# Patient Record
Sex: Female | Born: 1950 | Race: White | Hispanic: No | Marital: Married | Smoking: Never smoker
Health system: Southern US, Community
[De-identification: ages and names within clinical notes are randomized; demographics above are authoritative.]

## PROBLEM LIST (undated history)

## (undated) DIAGNOSIS — E7800 Pure hypercholesterolemia, unspecified: Secondary | ICD-10-CM

## (undated) DIAGNOSIS — K5790 Diverticulosis of intestine, part unspecified, without perforation or abscess without bleeding: Secondary | ICD-10-CM

## (undated) DIAGNOSIS — R112 Nausea with vomiting, unspecified: Secondary | ICD-10-CM

## (undated) DIAGNOSIS — D649 Anemia, unspecified: Secondary | ICD-10-CM

## (undated) DIAGNOSIS — M1990 Unspecified osteoarthritis, unspecified site: Secondary | ICD-10-CM

## (undated) DIAGNOSIS — I10 Essential (primary) hypertension: Secondary | ICD-10-CM

## (undated) DIAGNOSIS — E119 Type 2 diabetes mellitus without complications: Secondary | ICD-10-CM

## (undated) DIAGNOSIS — Z9889 Other specified postprocedural states: Secondary | ICD-10-CM

## (undated) DIAGNOSIS — Z87442 Personal history of urinary calculi: Secondary | ICD-10-CM

## (undated) HISTORY — PX: ABDOMINAL HYSTERECTOMY: SHX81

## (undated) HISTORY — PX: LAPAROSCOPIC SALPINGOOPHERECTOMY: SUR795

## (undated) HISTORY — PX: DILATION AND CURETTAGE OF UTERUS: SHX78

## (undated) HISTORY — PX: CHOLECYSTECTOMY: SHX55

## (undated) HISTORY — PX: COLONOSCOPY: SHX174

---

## 2004-05-22 ENCOUNTER — Ambulatory Visit: Payer: Self-pay | Admitting: Family Medicine

## 2004-05-28 ENCOUNTER — Ambulatory Visit: Payer: Self-pay | Admitting: Family Medicine

## 2004-06-17 ENCOUNTER — Ambulatory Visit: Payer: Self-pay | Admitting: Unknown Physician Specialty

## 2004-12-17 ENCOUNTER — Emergency Department: Payer: Self-pay | Admitting: Emergency Medicine

## 2005-06-10 ENCOUNTER — Ambulatory Visit: Payer: Self-pay | Admitting: Family Medicine

## 2006-08-17 ENCOUNTER — Ambulatory Visit: Payer: Self-pay | Admitting: Family Medicine

## 2007-09-09 ENCOUNTER — Ambulatory Visit: Payer: Self-pay

## 2008-09-18 ENCOUNTER — Ambulatory Visit: Payer: Self-pay | Admitting: Unknown Physician Specialty

## 2008-09-27 ENCOUNTER — Ambulatory Visit: Payer: Self-pay | Admitting: Family Medicine

## 2009-10-29 ENCOUNTER — Ambulatory Visit: Payer: Self-pay | Admitting: Family Medicine

## 2010-01-03 ENCOUNTER — Ambulatory Visit: Payer: Self-pay | Admitting: Family Medicine

## 2011-09-23 ENCOUNTER — Ambulatory Visit: Payer: Self-pay | Admitting: Family Medicine

## 2014-02-20 ENCOUNTER — Emergency Department: Payer: Self-pay | Admitting: Emergency Medicine

## 2014-02-20 LAB — BASIC METABOLIC PANEL
Anion Gap: 8 (ref 7–16)
BUN: 33 mg/dL — ABNORMAL HIGH (ref 7–18)
Calcium, Total: 9.1 mg/dL (ref 8.5–10.1)
Chloride: 109 mmol/L — ABNORMAL HIGH (ref 98–107)
Co2: 23 mmol/L (ref 21–32)
Creatinine: 1.25 mg/dL (ref 0.60–1.30)
EGFR (African American): 53 — ABNORMAL LOW
EGFR (Non-African Amer.): 46 — ABNORMAL LOW
Glucose: 129 mg/dL — ABNORMAL HIGH (ref 65–99)
Osmolality: 288 (ref 275–301)
Potassium: 5 mmol/L (ref 3.5–5.1)
Sodium: 140 mmol/L (ref 136–145)

## 2014-02-20 LAB — CBC
HCT: 37.3 % (ref 35.0–47.0)
HGB: 12.3 g/dL (ref 12.0–16.0)
MCH: 30.3 pg (ref 26.0–34.0)
MCHC: 32.8 g/dL (ref 32.0–36.0)
MCV: 92 fL (ref 80–100)
PLATELETS: 257 10*3/uL (ref 150–440)
RBC: 4.05 10*6/uL (ref 3.80–5.20)
RDW: 13.4 % (ref 11.5–14.5)
WBC: 7.4 10*3/uL (ref 3.6–11.0)

## 2015-12-28 ENCOUNTER — Other Ambulatory Visit: Payer: Self-pay | Admitting: Family Medicine

## 2015-12-28 DIAGNOSIS — Z1231 Encounter for screening mammogram for malignant neoplasm of breast: Secondary | ICD-10-CM

## 2016-01-14 ENCOUNTER — Ambulatory Visit
Admission: RE | Admit: 2016-01-14 | Discharge: 2016-01-14 | Disposition: A | Discharge status: Home / Self Care | Payer: Medicare Other | Source: Ambulatory Visit | Attending: Family Medicine | Admitting: Family Medicine

## 2016-01-14 DIAGNOSIS — Z1231 Encounter for screening mammogram for malignant neoplasm of breast: Secondary | ICD-10-CM | POA: Diagnosis present

## 2016-01-14 DIAGNOSIS — R928 Other abnormal and inconclusive findings on diagnostic imaging of breast: Secondary | ICD-10-CM | POA: Diagnosis not present

## 2016-01-23 ENCOUNTER — Other Ambulatory Visit: Payer: Self-pay | Admitting: Family Medicine

## 2016-01-23 DIAGNOSIS — N6320 Unspecified lump in the left breast, unspecified quadrant: Secondary | ICD-10-CM

## 2016-02-01 ENCOUNTER — Ambulatory Visit
Admission: RE | Admit: 2016-02-01 | Discharge: 2016-02-01 | Disposition: A | Discharge status: Home / Self Care | Payer: Medicare Other | Source: Ambulatory Visit | Attending: Family Medicine | Admitting: Family Medicine

## 2016-02-01 DIAGNOSIS — N6320 Unspecified lump in the left breast, unspecified quadrant: Secondary | ICD-10-CM

## 2016-02-01 DIAGNOSIS — N63 Unspecified lump in breast: Secondary | ICD-10-CM | POA: Diagnosis present

## 2016-02-01 DIAGNOSIS — R928 Other abnormal and inconclusive findings on diagnostic imaging of breast: Secondary | ICD-10-CM | POA: Diagnosis not present

## 2016-11-04 ENCOUNTER — Other Ambulatory Visit: Payer: Self-pay | Admitting: Family Medicine

## 2016-11-04 DIAGNOSIS — Z780 Asymptomatic menopausal state: Secondary | ICD-10-CM

## 2017-02-27 ENCOUNTER — Other Ambulatory Visit: Payer: Self-pay | Admitting: Orthopedic Surgery

## 2017-02-27 DIAGNOSIS — M1711 Unilateral primary osteoarthritis, right knee: Secondary | ICD-10-CM

## 2017-03-18 ENCOUNTER — Ambulatory Visit
Admission: RE | Admit: 2017-03-18 | Discharge: 2017-03-18 | Disposition: A | Discharge status: Home / Self Care | Payer: Medicare Other | Source: Ambulatory Visit | Attending: Orthopedic Surgery | Admitting: Orthopedic Surgery

## 2017-03-18 DIAGNOSIS — M1711 Unilateral primary osteoarthritis, right knee: Secondary | ICD-10-CM | POA: Insufficient documentation

## 2017-03-25 ENCOUNTER — Encounter
Admission: RE | Admit: 2017-03-25 | Discharge: 2017-03-25 | Disposition: A | Discharge status: Home / Self Care | Payer: Medicare Other | Source: Ambulatory Visit | Attending: Orthopedic Surgery | Admitting: Orthopedic Surgery

## 2017-03-25 DIAGNOSIS — D649 Anemia, unspecified: Secondary | ICD-10-CM | POA: Diagnosis not present

## 2017-03-25 DIAGNOSIS — E119 Type 2 diabetes mellitus without complications: Secondary | ICD-10-CM | POA: Insufficient documentation

## 2017-03-25 DIAGNOSIS — Z79899 Other long term (current) drug therapy: Secondary | ICD-10-CM | POA: Diagnosis not present

## 2017-03-25 DIAGNOSIS — E7800 Pure hypercholesterolemia, unspecified: Secondary | ICD-10-CM | POA: Insufficient documentation

## 2017-03-25 DIAGNOSIS — Z01818 Encounter for other preprocedural examination: Secondary | ICD-10-CM | POA: Insufficient documentation

## 2017-03-25 DIAGNOSIS — Z7982 Long term (current) use of aspirin: Secondary | ICD-10-CM | POA: Diagnosis not present

## 2017-03-25 DIAGNOSIS — M1711 Unilateral primary osteoarthritis, right knee: Secondary | ICD-10-CM | POA: Diagnosis not present

## 2017-03-25 DIAGNOSIS — Z7984 Long term (current) use of oral hypoglycemic drugs: Secondary | ICD-10-CM | POA: Insufficient documentation

## 2017-03-25 DIAGNOSIS — I10 Essential (primary) hypertension: Secondary | ICD-10-CM | POA: Diagnosis not present

## 2017-03-25 HISTORY — DX: Essential (primary) hypertension: I10

## 2017-03-25 HISTORY — DX: Pure hypercholesterolemia, unspecified: E78.00

## 2017-03-25 HISTORY — DX: Personal history of urinary calculi: Z87.442

## 2017-03-25 HISTORY — DX: Diverticulosis of intestine, part unspecified, without perforation or abscess without bleeding: K57.90

## 2017-03-25 HISTORY — DX: Unspecified osteoarthritis, unspecified site: M19.90

## 2017-03-25 HISTORY — DX: Anemia, unspecified: D64.9

## 2017-03-25 HISTORY — DX: Type 2 diabetes mellitus without complications: E11.9

## 2017-03-25 LAB — CBC
HEMATOCRIT: 36.3 % (ref 35.0–47.0)
Hemoglobin: 12.3 g/dL (ref 12.0–16.0)
MCH: 31.4 pg (ref 26.0–34.0)
MCHC: 34 g/dL (ref 32.0–36.0)
MCV: 92.5 fL (ref 80.0–100.0)
Platelets: 265 10*3/uL (ref 150–440)
RBC: 3.93 MIL/uL (ref 3.80–5.20)
RDW: 14.5 % (ref 11.5–14.5)
WBC: 6.6 10*3/uL (ref 3.6–11.0)

## 2017-03-25 LAB — PROTIME-INR
INR: 0.97
Prothrombin Time: 12.8 seconds (ref 11.4–15.2)

## 2017-03-25 LAB — URINALYSIS, COMPLETE (UACMP) WITH MICROSCOPIC
Bilirubin Urine: NEGATIVE
GLUCOSE, UA: NEGATIVE mg/dL
HGB URINE DIPSTICK: NEGATIVE
Ketones, ur: NEGATIVE mg/dL
NITRITE: POSITIVE — AB
PROTEIN: NEGATIVE mg/dL
RBC / HPF: NONE SEEN RBC/hpf (ref 0–5)
SPECIFIC GRAVITY, URINE: 1.006 (ref 1.005–1.030)
Squamous Epithelial / LPF: NONE SEEN
pH: 6 (ref 5.0–8.0)

## 2017-03-25 LAB — SURGICAL PCR SCREEN
MRSA, PCR: NEGATIVE
Staphylococcus aureus: POSITIVE — AB

## 2017-03-25 LAB — APTT: aPTT: 31 seconds (ref 24–36)

## 2017-03-25 LAB — BASIC METABOLIC PANEL
Anion gap: 8 (ref 5–15)
BUN: 29 mg/dL — AB (ref 6–20)
CALCIUM: 9.7 mg/dL (ref 8.9–10.3)
CHLORIDE: 108 mmol/L (ref 101–111)
CO2: 24 mmol/L (ref 22–32)
Creatinine, Ser: 1.14 mg/dL — ABNORMAL HIGH (ref 0.44–1.00)
GFR calc non Af Amer: 49 mL/min — ABNORMAL LOW (ref >60)
GFR, EST AFRICAN AMERICAN: 57 mL/min — AB (ref >60)
GLUCOSE: 83 mg/dL (ref 65–99)
Potassium: 4.2 mmol/L (ref 3.5–5.1)
Sodium: 140 mmol/L (ref 135–145)

## 2017-03-25 LAB — SEDIMENTATION RATE: SED RATE: 30 mm/h (ref 0–30)

## 2017-03-25 LAB — TYPE AND SCREEN
ABO/RH(D): O POS
ANTIBODY SCREEN: NEGATIVE

## 2017-03-25 NOTE — Patient Instructions (Signed)
Your procedure is scheduled on: April 07, 2017 [facility](TUESDAY ) Report to Same Day Surgery 2nd floor medical mall (Medical Mall Entrance-take elevator on left to 2nd floor.  Check in with surgery information desk.) To find out your arrival time please call [phone] between 1PM - 3PM on April 06, 2017 (MONDAY )   Remember: Instructions that are not followed completely may result in serious medical risk, up to and including death, or upon the discretion of your surgeon and anesthesiologist your surgery may need to be rescheduled.    _x___ 1. Do not eat food after midnight the night before your procedure. You may drink clear liquids up to 2 hours before you are scheduled to arrive at the hospital for your procedure.  Do not drink clear liquids within 2 hours of your scheduled arrival to the hospital.  Clear liquids include  --Water or Apple juice without pulp  --Clear carbohydrate beverage such as ClearFast or Gatorade  --Black Coffee or Clear Tea (No milk, no creamers, do not add anything to                  the coffee or Tea Type 1 and type 2 diabetics should only drink water.  No gum chewing or hard candies.     __x__ 2. No Alcohol for 24 hours before or after surgery.   __x__3. No Smoking for 24 prior to surgery.   ____  4. Bring all medications with you on the day of surgery if instructed.    __x__ 5. Notify your doctor if there is any change in your medical condition     (cold, fever, infections).     Do not wear jewelry, make-up, hairpins, clips or nail polish.  Do not wear lotions, powders, or perfumes.   Do not shave 48 hours prior to surgery. Men may shave face and neck.  Do not bring valuables to the hospital.    [facility] not responsible for any belongings or valuables.               Contacts, dentures or bridgework may not be worn into surgery.  Leave your suitcase in the car. After surgery it may be brought to your room.  For patients admitted to the  hospital, discharge time is determined by your treatment team                      Patients discharged the day of surgery will not be allowed to drive home.  You will need someone to drive you home and stay with you the night of your procedure.    Please read over the following fact sheets that you were given:   [facility] Preparing for [facility] and or MRSA Information   _x___ TAKE THE FOLLOWING MEDICATION THE MORNING OF SURGERY WITH A SIP OF WATER .   1. METOPROLOL    ____Fleets enema or Magnesium Citrate as directed.   _x___ Use CHG Soap or sage wipes as directed on instruction sheet   ____ Use inhalers on the day of surgery and bring to hospital day of surgery  __X__ Stop Metformin 2 days prior to surgery. (STOP METFORMIN ON SEPTEMBER  23 )    ____ Take 1/2 of usual insulin dose the night before surgery and none on the morning     surgery.   _x___ Follow recommendations from Cardiologist, Pulmonologist or PCP regarding          stopping Aspirin, Coumadin,  Plavix ,Eliquis, Effient, or Pradaxa, and Pletal. (STOP ASPIRIN SEVEN DAYS PRIOR TO SURGERY )  X____Stop Anti-inflammatories such as Advil, Aleve, Ibuprofen, Motrin, Naproxen, Naprosyn, Goodies powders or aspirin products. OK to take Tylenol   _x___ Stop supplements until after surgery.  But may continue Vitamin D, Vitamin B, and multivitamin (STOP KRILL OIL NOW )       ____ Bring C-Pap to the hospital.

## 2017-03-27 LAB — URINE CULTURE: Culture: >=100000 — AB

## 2017-04-07 ENCOUNTER — Encounter
Admission: RE | Disposition: A | Discharge status: Home Health | Payer: Self-pay | Source: Ambulatory Visit | Attending: Orthopedic Surgery

## 2017-04-07 ENCOUNTER — Inpatient Hospital Stay: Payer: Medicare Other

## 2017-04-07 ENCOUNTER — Inpatient Hospital Stay: Payer: Medicare Other | Admitting: Anesthesiology

## 2017-04-07 ENCOUNTER — Inpatient Hospital Stay
Admission: RE | Admit: 2017-04-07 | Discharge: 2017-04-09 | DRG: 470 | Disposition: A | Discharge status: Home Health | Payer: Medicare Other | Source: Ambulatory Visit | Attending: Orthopedic Surgery | Admitting: Orthopedic Surgery

## 2017-04-07 DIAGNOSIS — I10 Essential (primary) hypertension: Secondary | ICD-10-CM | POA: Diagnosis present

## 2017-04-07 DIAGNOSIS — E7800 Pure hypercholesterolemia, unspecified: Secondary | ICD-10-CM | POA: Diagnosis present

## 2017-04-07 DIAGNOSIS — Z6837 Body mass index (BMI) 37.0-37.9, adult: Secondary | ICD-10-CM

## 2017-04-07 DIAGNOSIS — E669 Obesity, unspecified: Secondary | ICD-10-CM | POA: Diagnosis present

## 2017-04-07 DIAGNOSIS — E119 Type 2 diabetes mellitus without complications: Secondary | ICD-10-CM | POA: Diagnosis present

## 2017-04-07 DIAGNOSIS — G8918 Other acute postprocedural pain: Secondary | ICD-10-CM

## 2017-04-07 DIAGNOSIS — M1711 Unilateral primary osteoarthritis, right knee: Secondary | ICD-10-CM | POA: Diagnosis present

## 2017-04-07 HISTORY — DX: Nausea with vomiting, unspecified: R11.2

## 2017-04-07 HISTORY — PX: TOTAL KNEE ARTHROPLASTY: SHX125

## 2017-04-07 HISTORY — DX: Other specified postprocedural states: Z98.890

## 2017-04-07 LAB — CBC
HCT: 37.3 % (ref 35.0–47.0)
HEMOGLOBIN: 12.5 g/dL (ref 12.0–16.0)
MCH: 31.3 pg (ref 26.0–34.0)
MCHC: 33.4 g/dL (ref 32.0–36.0)
MCV: 93.5 fL (ref 80.0–100.0)
PLATELETS: 232 10*3/uL (ref 150–440)
RBC: 3.98 MIL/uL (ref 3.80–5.20)
RDW: 14.6 % — ABNORMAL HIGH (ref 11.5–14.5)
WBC: 9 10*3/uL (ref 3.6–11.0)

## 2017-04-07 LAB — ABO/RH: ABO/RH(D): O POS

## 2017-04-07 LAB — GLUCOSE, CAPILLARY
Glucose-Capillary: 160 mg/dL — ABNORMAL HIGH (ref 65–99)
Glucose-Capillary: 119 mg/dL — ABNORMAL HIGH (ref 65–99)
Glucose-Capillary: 168 mg/dL — ABNORMAL HIGH (ref 65–99)
Glucose-Capillary: 168 mg/dL — ABNORMAL HIGH (ref 65–99)

## 2017-04-07 LAB — CREATININE, SERUM
CREATININE: 0.87 mg/dL (ref 0.44–1.00)
GFR calc non Af Amer: >60 mL/min (ref >60)

## 2017-04-07 SURGERY — ARTHROPLASTY, KNEE, TOTAL
Anesthesia: Spinal | Site: Knee | Laterality: Right | Wound class: Clean

## 2017-04-07 MED ORDER — ONDANSETRON HCL 4 MG/2ML IJ SOLN
INTRAMUSCULAR | Status: DC | PRN
  Administered 2017-04-07: 4 mg via INTRAVENOUS

## 2017-04-07 MED ORDER — SIMVASTATIN 20 MG PO TABS
20.0000 mg | ORAL_TABLET | Freq: Every day | ORAL | Status: DC
  Administered 2017-04-07 – 2017-04-08 (×2): 20 mg via ORAL
  Filled 2017-04-07 (×2): qty 1

## 2017-04-07 MED ORDER — METFORMIN HCL 500 MG PO TABS
1000.0000 mg | ORAL_TABLET | Freq: Two times a day (BID) | ORAL | Status: DC
  Administered 2017-04-08 – 2017-04-09 (×3): 1000 mg via ORAL
  Filled 2017-04-07 (×4): qty 2

## 2017-04-07 MED ORDER — PROPOFOL 500 MG/50ML IV EMUL
INTRAVENOUS | Status: AC
  Filled 2017-04-07: qty 50

## 2017-04-07 MED ORDER — METOPROLOL SUCCINATE ER 25 MG PO TB24
25.0000 mg | ORAL_TABLET | Freq: Every day | ORAL | Status: DC
  Administered 2017-04-08 – 2017-04-09 (×2): 25 mg via ORAL
  Filled 2017-04-07: qty 1

## 2017-04-07 MED ORDER — ONDANSETRON HCL 4 MG/2ML IJ SOLN: 4.0000 mg | Freq: Once | INTRAMUSCULAR | Status: DC | PRN

## 2017-04-07 MED ORDER — NEOMYCIN-POLYMYXIN B GU 40-200000 IR SOLN
Status: DC | PRN
  Administered 2017-04-07: 14 mL

## 2017-04-07 MED ORDER — LINAGLIPTIN 5 MG PO TABS
5.0000 mg | ORAL_TABLET | Freq: Every day | ORAL | Status: DC
  Administered 2017-04-08 – 2017-04-09 (×2): 5 mg via ORAL
  Filled 2017-04-07 (×2): qty 1

## 2017-04-07 MED ORDER — HYDROCHLOROTHIAZIDE 12.5 MG PO CAPS
37.5000 mg | ORAL_CAPSULE | Freq: Every day | ORAL | Status: DC
  Administered 2017-04-08 – 2017-04-09 (×2): 37.5 mg via ORAL
  Filled 2017-04-07 (×2): qty 3

## 2017-04-07 MED ORDER — MENTHOL 3 MG MT LOZG
1.0000 | LOZENGE | OROMUCOSAL | Status: DC | PRN
Start: 2017-04-07 — End: 2017-04-09
  Filled 2017-04-07: qty 9

## 2017-04-07 MED ORDER — PHENOL 1.4 % MT LIQD
1.0000 | OROMUCOSAL | Status: DC | PRN
  Filled 2017-04-07: qty 177

## 2017-04-07 MED ORDER — EPHEDRINE SULFATE 50 MG/ML IJ SOLN
INTRAMUSCULAR | Status: AC
  Filled 2017-04-07: qty 1

## 2017-04-07 MED ORDER — METOCLOPRAMIDE HCL 5 MG/ML IJ SOLN: 5.0000 mg | Freq: Three times a day (TID) | INTRAMUSCULAR | Status: DC | PRN

## 2017-04-07 MED ORDER — FAMOTIDINE 20 MG PO TABS
20.0000 mg | ORAL_TABLET | Freq: Once | ORAL | Status: AC
  Administered 2017-04-07: 20 mg via ORAL

## 2017-04-07 MED ORDER — MIDAZOLAM HCL 2 MG/2ML IJ SOLN
INTRAMUSCULAR | Status: AC
  Filled 2017-04-07: qty 2

## 2017-04-07 MED ORDER — LISINOPRIL 10 MG PO TABS
30.0000 mg | ORAL_TABLET | Freq: Every day | ORAL | Status: DC
  Administered 2017-04-08 – 2017-04-09 (×2): 30 mg via ORAL
  Filled 2017-04-07 (×2): qty 1

## 2017-04-07 MED ORDER — LISINOPRIL-HYDROCHLOROTHIAZIDE 20-25 MG PO TABS: 1.5000 | ORAL_TABLET | Freq: Every day | ORAL | Status: DC

## 2017-04-07 MED ORDER — CEFAZOLIN SODIUM-DEXTROSE 2-4 GM/100ML-% IV SOLN
2.0000 g | Freq: Once | INTRAVENOUS | Status: AC
  Administered 2017-04-07: 2 g via INTRAVENOUS

## 2017-04-07 MED ORDER — METHOCARBAMOL 1000 MG/10ML IJ SOLN
500.0000 mg | Freq: Four times a day (QID) | INTRAVENOUS | Status: DC | PRN
  Filled 2017-04-07: qty 5

## 2017-04-07 MED ORDER — ZOLPIDEM TARTRATE 5 MG PO TABS: 5.0000 mg | ORAL_TABLET | Freq: Every evening | ORAL | Status: DC | PRN

## 2017-04-07 MED ORDER — BUPIVACAINE HCL (PF) 0.5 % IJ SOLN
INTRAMUSCULAR | Status: DC | PRN
  Administered 2017-04-07: 3 mL

## 2017-04-07 MED ORDER — ALUM & MAG HYDROXIDE-SIMETH 200-200-20 MG/5ML PO SUSP
30.0000 mL | Freq: Three times a day (TID) | ORAL | Status: DC | PRN
  Administered 2017-04-07: 30 mL via ORAL
  Filled 2017-04-07 (×3): qty 30

## 2017-04-07 MED ORDER — FAMOTIDINE 20 MG PO TABS
ORAL_TABLET | ORAL | Status: AC
  Filled 2017-04-07: qty 1

## 2017-04-07 MED ORDER — ACETAMINOPHEN 325 MG PO TABS
650.0000 mg | ORAL_TABLET | Freq: Four times a day (QID) | ORAL | Status: DC | PRN
  Administered 2017-04-08: 650 mg via ORAL
  Filled 2017-04-07: qty 2

## 2017-04-07 MED ORDER — ASPIRIN EC 81 MG PO TBEC
81.0000 mg | DELAYED_RELEASE_TABLET | Freq: Every day | ORAL | Status: DC
  Administered 2017-04-08 – 2017-04-09 (×2): 81 mg via ORAL
  Filled 2017-04-07 (×2): qty 1

## 2017-04-07 MED ORDER — SODIUM CHLORIDE 0.9 % IV SOLN
INTRAVENOUS | Status: DC
  Administered 2017-04-07: 10:00:00 via INTRAVENOUS

## 2017-04-07 MED ORDER — EPHEDRINE SULFATE 50 MG/ML IJ SOLN
INTRAMUSCULAR | Status: DC | PRN
  Administered 2017-04-07: 10 mg via INTRAVENOUS

## 2017-04-07 MED ORDER — METOCLOPRAMIDE HCL 10 MG PO TABS
5.0000 mg | ORAL_TABLET | Freq: Three times a day (TID) | ORAL | Status: DC | PRN
  Administered 2017-04-08: 10 mg via ORAL
  Filled 2017-04-07: qty 1

## 2017-04-07 MED ORDER — CEFAZOLIN SODIUM-DEXTROSE 2-4 GM/100ML-% IV SOLN
INTRAVENOUS | Status: AC
  Filled 2017-04-07: qty 100

## 2017-04-07 MED ORDER — TRANEXAMIC ACID 1000 MG/10ML IV SOLN
1000.0000 mg | INTRAVENOUS | Status: DC
  Filled 2017-04-07: qty 10

## 2017-04-07 MED ORDER — MAGNESIUM HYDROXIDE 400 MG/5ML PO SUSP
30.0000 mL | Freq: Every day | ORAL | Status: DC | PRN
  Administered 2017-04-08: 30 mL via ORAL
  Filled 2017-04-07: qty 30

## 2017-04-07 MED ORDER — MAGNESIUM CITRATE PO SOLN
1.0000 | Freq: Once | ORAL | Status: DC | PRN
  Filled 2017-04-07: qty 296

## 2017-04-07 MED ORDER — INSULIN ASPART 100 UNIT/ML ~~LOC~~ SOLN
0.0000 [IU] | Freq: Three times a day (TID) | SUBCUTANEOUS | Status: DC
  Administered 2017-04-07: 3 [IU] via SUBCUTANEOUS
  Administered 2017-04-08: 5 [IU] via SUBCUTANEOUS
  Administered 2017-04-08 – 2017-04-09 (×3): 3 [IU] via SUBCUTANEOUS
  Filled 2017-04-07 (×5): qty 1

## 2017-04-07 MED ORDER — ONDANSETRON HCL 4 MG/2ML IJ SOLN
INTRAMUSCULAR | Status: AC
  Filled 2017-04-07: qty 2

## 2017-04-07 MED ORDER — MORPHINE SULFATE 10 MG/ML IJ SOLN
INTRAMUSCULAR | Status: DC | PRN
Start: 2017-04-07 — End: 2017-04-07
  Administered 2017-04-07: 10 mg

## 2017-04-07 MED ORDER — BISACODYL 10 MG RE SUPP: 10.0000 mg | Freq: Every day | RECTAL | Status: DC | PRN

## 2017-04-07 MED ORDER — BUPIVACAINE-EPINEPHRINE (PF) 0.25% -1:200000 IJ SOLN
INTRAMUSCULAR | Status: DC | PRN
  Administered 2017-04-07: 30 mL

## 2017-04-07 MED ORDER — ONDANSETRON HCL 4 MG PO TABS
4.0000 mg | ORAL_TABLET | Freq: Four times a day (QID) | ORAL | Status: DC | PRN
  Administered 2017-04-08: 4 mg via ORAL
  Filled 2017-04-07: qty 1

## 2017-04-07 MED ORDER — SODIUM CHLORIDE 0.9 % IV SOLN
INTRAVENOUS | Status: DC
  Administered 2017-04-07: 16:00:00 via INTRAVENOUS

## 2017-04-07 MED ORDER — TRANEXAMIC ACID 1000 MG/10ML IV SOLN
INTRAVENOUS | Status: DC | PRN
  Administered 2017-04-07: 1000 mg via INTRAVENOUS

## 2017-04-07 MED ORDER — ONDANSETRON HCL 4 MG/2ML IJ SOLN
4.0000 mg | Freq: Four times a day (QID) | INTRAMUSCULAR | Status: DC | PRN
  Filled 2017-04-07: qty 2

## 2017-04-07 MED ORDER — DOCUSATE SODIUM 100 MG PO CAPS
100.0000 mg | ORAL_CAPSULE | Freq: Two times a day (BID) | ORAL | Status: DC
  Administered 2017-04-07 – 2017-04-09 (×4): 100 mg via ORAL
  Filled 2017-04-07 (×4): qty 1

## 2017-04-07 MED ORDER — FENTANYL CITRATE (PF) 100 MCG/2ML IJ SOLN: 25.0000 ug | INTRAMUSCULAR | Status: DC | PRN

## 2017-04-07 MED ORDER — ALUM & MAG HYDROXIDE-SIMETH 200-200-20 MG/5ML PO SUSP: 30.0000 mL | ORAL | Status: DC | PRN

## 2017-04-07 MED ORDER — CEFAZOLIN SODIUM-DEXTROSE 2-4 GM/100ML-% IV SOLN
2.0000 g | Freq: Four times a day (QID) | INTRAVENOUS | Status: DC
  Filled 2017-04-07 (×3): qty 100

## 2017-04-07 MED ORDER — PROPOFOL 500 MG/50ML IV EMUL
INTRAVENOUS | Status: DC | PRN
  Administered 2017-04-07: 75 ug/kg/min via INTRAVENOUS

## 2017-04-07 MED ORDER — FAMOTIDINE 20 MG PO TABS
20.0000 mg | ORAL_TABLET | Freq: Every day | ORAL | Status: DC
  Administered 2017-04-07 – 2017-04-09 (×3): 20 mg via ORAL
  Filled 2017-04-07 (×3): qty 1

## 2017-04-07 MED ORDER — OXYCODONE HCL 5 MG PO TABS
5.0000 mg | ORAL_TABLET | ORAL | Status: DC | PRN
  Administered 2017-04-07 (×2): 5 mg via ORAL
  Administered 2017-04-07 – 2017-04-08 (×3): 10 mg via ORAL
  Administered 2017-04-09 (×2): 5 mg via ORAL
  Filled 2017-04-07 (×3): qty 1
  Filled 2017-04-07 (×3): qty 2
  Filled 2017-04-07: qty 1

## 2017-04-07 MED ORDER — SODIUM CHLORIDE 0.9 % IV SOLN
INTRAVENOUS | Status: DC | PRN
  Administered 2017-04-07: 30 ug/min via INTRAVENOUS

## 2017-04-07 MED ORDER — ENOXAPARIN SODIUM 30 MG/0.3ML ~~LOC~~ SOLN
30.0000 mg | Freq: Two times a day (BID) | SUBCUTANEOUS | Status: DC
  Administered 2017-04-08 – 2017-04-09 (×3): 30 mg via SUBCUTANEOUS
  Filled 2017-04-07 (×3): qty 0.3

## 2017-04-07 MED ORDER — GLIPIZIDE ER 10 MG PO TB24
20.0000 mg | ORAL_TABLET | Freq: Every day | ORAL | Status: DC
  Administered 2017-04-08 – 2017-04-09 (×2): 20 mg via ORAL
  Filled 2017-04-07 (×2): qty 2

## 2017-04-07 MED ORDER — METHOCARBAMOL 500 MG PO TABS: 500.0000 mg | ORAL_TABLET | Freq: Four times a day (QID) | ORAL | Status: DC | PRN

## 2017-04-07 MED ORDER — DIPHENHYDRAMINE HCL 12.5 MG/5ML PO ELIX: 12.5000 mg | ORAL_SOLUTION | ORAL | Status: DC | PRN

## 2017-04-07 MED ORDER — DEXTROSE 5 % IV SOLN
Freq: Four times a day (QID) | INTRAVENOUS | Status: AC
  Administered 2017-04-07 – 2017-04-08 (×3): via INTRAVENOUS
  Filled 2017-04-07 (×5): qty 20

## 2017-04-07 MED ORDER — ONDANSETRON HCL 4 MG/2ML IJ SOLN
INTRAMUSCULAR | Status: AC
  Administered 2017-04-07: 18:00:00
  Filled 2017-04-07: qty 2

## 2017-04-07 MED ORDER — ACETAMINOPHEN 650 MG RE SUPP: 650.0000 mg | Freq: Four times a day (QID) | RECTAL | Status: DC | PRN

## 2017-04-07 MED ORDER — MORPHINE SULFATE (PF) 2 MG/ML IV SOLN
2.0000 mg | INTRAVENOUS | Status: DC | PRN
  Administered 2017-04-07: 2 mg via INTRAVENOUS
  Filled 2017-04-07: qty 1

## 2017-04-07 MED ORDER — PIOGLITAZONE HCL 15 MG PO TABS
45.0000 mg | ORAL_TABLET | Freq: Every day | ORAL | Status: DC
  Administered 2017-04-08 – 2017-04-09 (×2): 45 mg via ORAL
  Filled 2017-04-07 (×3): qty 3

## 2017-04-07 MED ORDER — SODIUM CHLORIDE 0.9 % IV SOLN
INTRAVENOUS | Status: DC | PRN
  Administered 2017-04-07: 60 mL

## 2017-04-07 SURGICAL SUPPLY — 61 items
BANDAGE ACE 6X5 VEL STRL LF (GAUZE/BANDAGES/DRESSINGS) ×3 IMPLANT
BLADE SAW 1 (BLADE) ×3 IMPLANT
BLOCK CUTTING FEMUR 3 RT MED (MISCELLANEOUS) IMPLANT
BLOCK CUTTING TIBIAL 3 RT (MISCELLANEOUS) IMPLANT
BLOCK CUTTING TIBIAL 4 RT MIS (MISCELLANEOUS) IMPLANT
BONE CEMENT GENTAMICIN (Cement) ×6 IMPLANT
CANISTER SUCT 1200ML W/VALVE (MISCELLANEOUS) ×3 IMPLANT
CANISTER SUCT 3000ML PPV (MISCELLANEOUS) ×6 IMPLANT
CAPT KNEE TOTAL 3 ×3 IMPLANT
CATH FOL LEG HOLDER (MISCELLANEOUS) ×3 IMPLANT
CATH TRAY METER 16FR LF (MISCELLANEOUS) ×3 IMPLANT
CEMENT BONE GENTAMICIN 40 (Cement) ×2 IMPLANT
CHLORAPREP W/TINT 26ML (MISCELLANEOUS) ×6 IMPLANT
COOLER POLAR GLACIER W/PUMP (MISCELLANEOUS) ×3 IMPLANT
CUFF TOURN 24 STER (MISCELLANEOUS) IMPLANT
CUFF TOURN 30 STER DUAL PORT (MISCELLANEOUS) IMPLANT
DRAPE SHEET LG 3/4 BI-LAMINATE (DRAPES) ×6 IMPLANT
ELECT CAUTERY BLADE 6.4 (BLADE) ×3 IMPLANT
ELECT REM PT RETURN 9FT ADLT (ELECTROSURGICAL) ×3
ELECTRODE REM PT RTRN 9FT ADLT (ELECTROSURGICAL) ×1 IMPLANT
FEMUR BONE MODEL (Bone Implant) ×3 IMPLANT
GAUZE PETRO XEROFOAM 1X8 (MISCELLANEOUS) ×3 IMPLANT
GAUZE SPONGE 4X4 12PLY STRL (GAUZE/BANDAGES/DRESSINGS) ×3 IMPLANT
GLOVE BIOGEL PI IND STRL 9 (GLOVE) ×1 IMPLANT
GLOVE BIOGEL PI INDICATOR 9 (GLOVE) ×2
GLOVE INDICATOR 8.0 STRL GRN (GLOVE) ×3 IMPLANT
GLOVE SURG ORTHO 8.0 STRL STRW (GLOVE) ×3 IMPLANT
GLOVE SURG SYN 9.0  PF PI (GLOVE) ×2
GLOVE SURG SYN 9.0 PF PI (GLOVE) ×1 IMPLANT
GOWN SRG 2XL LVL 4 RGLN SLV (GOWNS) ×1 IMPLANT
GOWN STRL NON-REIN 2XL LVL4 (GOWNS) ×2
GOWN STRL REUS W/ TWL LRG LVL3 (GOWN DISPOSABLE) ×1 IMPLANT
GOWN STRL REUS W/ TWL XL LVL3 (GOWN DISPOSABLE) ×1 IMPLANT
GOWN STRL REUS W/TWL LRG LVL3 (GOWN DISPOSABLE) ×2
GOWN STRL REUS W/TWL XL LVL3 (GOWN DISPOSABLE) ×2
HOOD PEEL AWAY FLYTE STAYCOOL (MISCELLANEOUS) ×6 IMPLANT
IMMBOLIZER KNEE 19 BLUE UNIV (SOFTGOODS) ×3 IMPLANT
KIT RM TURNOVER STRD PROC AR (KITS) ×3 IMPLANT
KNEE MEDACTA TIBIAL/FEMORAL BL (Knees) ×3 IMPLANT
KNIFE SCULPS 14X20 (INSTRUMENTS) ×3 IMPLANT
NDL SAFETY 18GX1.5 (NEEDLE) ×3 IMPLANT
NEEDLE SPNL 18GX3.5 QUINCKE PK (NEEDLE) ×3 IMPLANT
NEEDLE SPNL 20GX3.5 QUINCKE YW (NEEDLE) ×3 IMPLANT
NS IRRIG 1000ML POUR BTL (IV SOLUTION) ×3 IMPLANT
PACK TOTAL KNEE (MISCELLANEOUS) ×3 IMPLANT
PAD WRAPON POLAR KNEE (MISCELLANEOUS) ×1 IMPLANT
PULSAVAC PLUS IRRIG FAN TIP (DISPOSABLE) ×3
SOL .9 NS 3000ML IRR  AL (IV SOLUTION) ×2
SOL .9 NS 3000ML IRR UROMATIC (IV SOLUTION) ×1 IMPLANT
STAPLER SKIN PROX 35W (STAPLE) ×3 IMPLANT
SUCTION FRAZIER HANDLE 10FR (MISCELLANEOUS) ×2
SUCTION TUBE FRAZIER 10FR DISP (MISCELLANEOUS) ×1 IMPLANT
SUT DVC 2 QUILL PDO  T11 36X36 (SUTURE) ×2
SUT DVC 2 QUILL PDO T11 36X36 (SUTURE) ×1 IMPLANT
SUT V-LOC 90 ABS DVC 3-0 CL (SUTURE) ×3 IMPLANT
SYR 20CC LL (SYRINGE) ×3 IMPLANT
SYR 50ML LL SCALE MARK (SYRINGE) ×6 IMPLANT
TIP FAN IRRIG PULSAVAC PLUS (DISPOSABLE) ×1 IMPLANT
TOWEL OR 17X26 4PK STRL BLUE (TOWEL DISPOSABLE) ×3 IMPLANT
TOWER CARTRIDGE SMART MIX (DISPOSABLE) ×3 IMPLANT
WRAPON POLAR PAD KNEE (MISCELLANEOUS) ×3

## 2017-04-07 NOTE — Anesthesia Procedure Notes (Signed)
Spinal  Patient location during procedure: OR Start time: 04/07/2017 11:20 AM End time: 04/07/2017 11:25 AM Staffing Resident/CRNA: Nelda Marseille Performed: resident/CRNA  Preanesthetic Checklist Completed: patient identified, site marked, surgical consent, pre-op evaluation, timeout performed, IV checked, risks and benefits discussed and monitors and equipment checked Spinal Block Patient position: sitting Prep: Betadine Patient monitoring: heart rate, continuous pulse ox, blood pressure and cardiac monitor Approach: right paramedian Location: L3-4 Injection technique: single-shot Needle Needle type: Whitacre and Introducer  Needle gauge: 25 G Needle length: 9 cm Additional Notes Negative paresthesia. Negative blood return. Positive free-flowing CSF. Expiration date of kit checked and confirmed. Patient tolerated procedure well, without complications.

## 2017-04-07 NOTE — Progress Notes (Signed)
15 minute call to floor. 

## 2017-04-07 NOTE — Anesthesia Post-op Follow-up Note (Signed)
Anesthesia QCDR form completed.        

## 2017-04-07 NOTE — NC FL2 (Signed)
[place] MEDICAID FL2 LEVEL OF CARE SCREENING TOOL     IDENTIFICATION  Patient Name: Emily Jacobs Birthdate: 04-30-1951 Sex: female Admission Date (Current Location): 04/07/2017  [place] Number:  Chiropodist and Address:  [facility], [address], [place], [place] [address]      Provider Number: [id number]  Attending Physician Name and Address:  Kennedy Bucker, MD  Relative Name and Phone Number:       Current Level of Care: Hospital Recommended Level of Care: Skilled Nursing Facility Prior Approval Number:    Date Approved/Denied:   PASRR Number:   [id number]  Discharge Plan: SNF    Current Diagnoses: Patient Active Problem List   Diagnosis Date Noted  . Primary localized osteoarthritis of right knee 04/07/2017    Orientation RESPIRATION BLADDER Height & Weight     Self, Time, Situation, Place  O2 (Nasal Cannula 2L) Continent Weight: 213 lb (96.6 kg) Height:   (160 cm)  BEHAVIORAL SYMPTOMS/MOOD NEUROLOGICAL BOWEL NUTRITION STATUS      Continent Diet (To be advanced)  AMBULATORY STATUS COMMUNICATION OF NEEDS Skin   Extensive Assist Verbally Surgical wounds (Right Knee; Closed)                       Personal Care Assistance Level of Assistance  Bathing, Feeding, Dressing Bathing Assistance: Limited assistance Feeding assistance: Independent Dressing Assistance: Limited assistance     Functional Limitations Info  Sight, Hearing, Speech Sight Info: Adequate Hearing Info: Adequate Speech Info: Adequate    SPECIAL CARE FACTORS FREQUENCY  PT (By licensed PT), OT (By licensed OT)     PT Frequency:  (5) OT Frequency:  (5)            Contractures      Additional Factors Info  Code Status, Allergies Code Status Info:  (DNR) Allergies Info:  (AMLODIPINE, OTHER, CODEINE)           Current Medications (04/07/2017):  This is the current hospital active medication list Current  Facility-Administered Medications  Medication Dose Route Frequency Provider Last Rate Last Dose  . 0.9 %  sodium chloride infusion   Intravenous Continuous Kennedy Bucker, MD 75 mL/hr at 04/07/17 1546    . acetaminophen (TYLENOL) tablet 650 mg  650 mg Oral Q6H PRN Kennedy Bucker, MD       Or  . acetaminophen (TYLENOL) suppository 650 mg  650 mg Rectal Q6H PRN Kennedy Bucker, MD      . alum & mag hydroxide-simeth (MAALOX/MYLANTA) 200-200-20 MG/5ML suspension 30 mL  30 mL Oral Q4H PRN Kennedy Bucker, MD      . aspirin EC tablet 81 mg  81 mg Oral Daily Kennedy Bucker, MD      . bisacodyl (DULCOLAX) suppository 10 mg  10 mg Rectal Daily PRN Kennedy Bucker, MD      . ceFAZolin (ANCEF) 2 g in dextrose 5 % 100 mL injection   Intravenous Q6H Kennedy Bucker, MD      . diphenhydrAMINE (BENADRYL) 12.5 MG/5ML elixir 12.5-25 mg  12.5-25 mg Oral Q4H PRN Kennedy Bucker, MD      . docusate sodium (COLACE) capsule 100 mg  100 mg Oral BID Kennedy Bucker, MD      . Melene Muller ON 04/08/2017] enoxaparin (LOVENOX) injection 30 mg  30 mg Subcutaneous Q12H Kennedy Bucker, MD      . famotidine (PEPCID) 20 MG tablet           . [  START ON 04/08/2017] glipiZIDE (GLUCOTROL XL) 24 hr tablet 20 mg  20 mg Oral QAC breakfast Kennedy Bucker, MD      . lisinopril (PRINIVIL,ZESTRIL) tablet 30 mg  30 mg Oral Daily Kennedy Bucker, MD       And  . hydrochlorothiazide (MICROZIDE) capsule 37.5 mg  37.5 mg Oral Daily Kennedy Bucker, MD      . insulin aspart (novoLOG) injection 0-15 Units  0-15 Units Subcutaneous TID WC Kennedy Bucker, MD      . Melene Muller ON 04/08/2017] linagliptin (TRADJENTA) tablet 5 mg  5 mg Oral Daily Kennedy Bucker, MD      . magnesium citrate solution 1 Bottle  1 Bottle Oral Once PRN Kennedy Bucker, MD      . magnesium hydroxide (MILK OF MAGNESIA) suspension 30 mL  30 mL Oral Daily PRN Kennedy Bucker, MD      . menthol-cetylpyridinium (CEPACOL) lozenge 3 mg  1 lozenge Oral PRN Kennedy Bucker, MD       Or  . phenol (CHLORASEPTIC) mouth  spray 1 spray  1 spray Mouth/Throat PRN Kennedy Bucker, MD      . metFORMIN (GLUCOPHAGE) tablet 1,000 mg  1,000 mg Oral BID WC Kennedy Bucker, MD      . methocarbamol (ROBAXIN) tablet 500 mg  500 mg Oral Q6H PRN Kennedy Bucker, MD       Or  . methocarbamol (ROBAXIN) 500 mg in dextrose 5 % 50 mL IVPB  500 mg Intravenous Q6H PRN Kennedy Bucker, MD      . metoCLOPramide (REGLAN) tablet 5-10 mg  5-10 mg Oral Q8H PRN Kennedy Bucker, MD       Or  . metoCLOPramide (REGLAN) injection 5-10 mg  5-10 mg Intravenous Q8H PRN Kennedy Bucker, MD      . Melene Muller ON 04/08/2017] metoprolol succinate (TOPROL-XL) 24 hr tablet 25 mg  25 mg Oral Daily Kennedy Bucker, MD      . morphine 2 MG/ML injection 2 mg  2 mg Intravenous Q2H PRN Kennedy Bucker, MD      . ondansetron [facility]) 4 MG/2ML injection           . ondansetron (ZOFRAN) tablet 4 mg  4 mg Oral Q6H PRN Kennedy Bucker, MD       Or  . ondansetron [facility]) injection 4 mg  4 mg Intravenous Q6H PRN Kennedy Bucker, MD      . oxyCODONE (Oxy IR/ROXICODONE) immediate release tablet 5-10 mg  5-10 mg Oral Q3H PRN Kennedy Bucker, MD   5 mg at 04/07/17 1530  . pioglitazone (ACTOS) tablet 45 mg  45 mg Oral Daily Kennedy Bucker, MD      . simvastatin (ZOCOR) tablet 20 mg  20 mg Oral QHS Kennedy Bucker, MD      . zolpidem [facility]) tablet 5 mg  5 mg Oral QHS PRN Kennedy Bucker, MD         Discharge Medications: Please see discharge summary for a list of discharge medications.  Relevant Imaging Results:  Relevant Lab Results:   Additional Information  (SSN: [id number])  Payton Spark, Student-Social Work

## 2017-04-07 NOTE — Progress Notes (Signed)
Ordered some maalox per Dr. Maisie Fus.

## 2017-04-07 NOTE — Progress Notes (Signed)
Patient states her stomach burning is feeling better Already.  Dr. Maisie Fus aware.

## 2017-04-07 NOTE — Progress Notes (Signed)
PT Cancellation Note  Patient Details Name: Emily Jacobs MRN: [id number] DOB: 06-12-1951   Cancelled Treatment:    Reason Eval/Treat Not Completed: Other (comment).   Attempted to see pt x2 this afternoon but on both attempts pt did not have full sensation back in RLE.  Will attempt to see pt again on 04/08/17.   Encarnacion Chu PT, DPT 04/07/2017, 4:13 PM

## 2017-04-07 NOTE — Anesthesia Preprocedure Evaluation (Signed)
Anesthesia Evaluation  Patient identified by MRN, date of birth, ID band Patient awake    Reviewed: Allergy & Precautions, NPO status , Patient's Chart, lab work & pertinent test results, reviewed documented beta blocker date and time   History of Anesthesia Complications (+) PONV and history of anesthetic complications  Airway Mallampati: III  TM Distance: >3 FB     Dental  (+) Chipped   Pulmonary           Cardiovascular hypertension, Pt. on medications and Pt. on home beta blockers      Neuro/Psych    GI/Hepatic   Endo/Other  diabetes, Type 2  Renal/GU      Musculoskeletal  (+) Arthritis ,   Abdominal   Peds  Hematology  (+) anemia ,   Anesthesia Other Findings Obese.  Reproductive/Obstetrics                             Anesthesia Physical Anesthesia Plan  ASA: III  Anesthesia Plan: Spinal   Post-op Pain Management:    Induction:   PONV Risk Score and Plan:   Airway Management Planned:   Additional Equipment:   Intra-op Plan:   Post-operative Plan:   Informed Consent: I have reviewed the patients History and Physical, chart, labs and discussed the procedure including the risks, benefits and alternatives for the proposed anesthesia with the patient or authorized representative who has indicated his/her understanding and acceptance.     Plan Discussed with: CRNA  Anesthesia Plan Comments:         Anesthesia Quick Evaluation

## 2017-04-07 NOTE — Transfer of Care (Signed)
Immediate Anesthesia Transfer of Care Note  Patient: Emily Jacobs  Procedure(s) Performed: Procedure(s): TOTAL KNEE ARTHROPLASTY (Right)  Patient Location: PACU  Anesthesia Type:Spinal  Level of Consciousness: awake, alert  and oriented  Airway & Oxygen Therapy: Patient Spontanous Breathing and Patient connected to face mask oxygen  Post-op Assessment: Report given to RN and Post -op Vital signs reviewed and stable  Post vital signs: Reviewed and stable  Last Vitals:  Vitals:   04/07/17 0949  BP: (!) 154/59  Pulse: 74  Resp: 18  Temp: 36.5 C  SpO2: 100%    Last Pain:  Vitals:   04/07/17 0949  TempSrc: Oral  PainSc: 7       Patients Stated Pain Goal: 2 (04/07/17 0949)  Complications: No apparent anesthesia complications

## 2017-04-07 NOTE — Op Note (Signed)
04/07/2017  1:03 PM  PATIENT:  Emily Jacobs  66 y.o. female  PRE-OPERATIVE DIAGNOSIS:  PRIMARY OSTEOARTHRITIS OF RIGHT KNEE  POST-OPERATIVE DIAGNOSIS:  PRIMARY OSTEOARTHRITIS OF RIGHT KNEE  PROCEDURE:  Procedure(s): TOTAL KNEE ARTHROPLASTY (Right)  SURGEON: Leitha Schuller, MD  ASSISTANTS:Chris Floyce Stakes [facility]  ANESTHESIA:   spinal  EBL:  Total I/O In: 800 [I.V.:800] Out: 650 [Urine:600; Blood:50]  BLOOD ADMINISTERED:none  DRAINS: none   LOCAL MEDICATIONS USED:  MARCAINE    and OTHER morphine, exparel  SPECIMEN:  No Specimen  DISPOSITION OF SPECIMEN:  N/A  COUNTS:  YES   TOURNIQUET:  56 Minutes at 300 mmHg  IMPLANTS:  Medacta GMK sphere 3 femur 3 tibia was short stem and 12 mm insert, to patella all components cemented  DICTATION: .Dragon Dictation  patient brought the operating room and after adequate spinal anesthesia was obtained rightleg was prepped and draped in sterile fashion. After patient identification and timeout procedures were completed tourniquet was raised and midline skin incision was made followed by medial parapatellar arthrotomy. The knee revealed extensive medialcompartment degenerative changes with somesynovitis. The fat pad and anterior cruciate ligament and PCL were excised and the proximal tibia cutting guide was applied to the proximal tibia and axial tibia cut carried out followed by distal femur cut. The 4-in-1 cutting guide was applied to the femur anterior posterior and chamfer cuts made. The posterior horns of the menisci could be resected this time and the size 3tibia baseplate trial was placed tendon disposition and proximal reaming carried out for short stem. The keel punch was placed followed by the femoral trial and a 12mm insert gave good stability through range of motion. This was thenchosen as the final implant. Distal femur drill holes were made followed by the trochlear groove cut and then trials were removed. The patella was quite thick  and resection was carried out drilling plate carried out and then a size 2 patella fit well. These trials were all removed and tourniquet was let down at this point with hemostasis checked electrocautery. Thelocal anesthetic noted above was infiltrated in the para-articular tissue. The tourniquet was then raised and the bony surfaces thoroughly irrigated and dried. Tibial component was cemented in place first followed by the plastic insert and then the femoral component and the knee placed in extension with excess cement being removed. Next the patellar button was clamped into place and after the cement entirely set the clamp was removed the patella tracked well with the tourniquet down the knee was thoroughly irrigated and then closed with a heavy Quill with 3-0 locking suture followed by skin staples.Incisional wound vac, Polar Care and Ace wrap applied  PLAN OF CARE: Admit to inpatient   PATIENT DISPOSITION:  PACU - hemodynamically stable.

## 2017-04-07 NOTE — H&P (Signed)
Reviewed paper H+P, will be scanned into chart. No changes noted.  

## 2017-04-07 NOTE — Progress Notes (Signed)
Dr. Maisie Fus aware of bp, in to talk with patient And will proceed from there.

## 2017-04-07 NOTE — Anesthesia Procedure Notes (Signed)
Date/Time: 04/07/2017 11:38 AM Performed by: Junious Silk Pre-anesthesia Checklist: Patient identified, Emergency Drugs available, Suction available, Patient being monitored and Timeout performed Oxygen Delivery Method: Simple face mask

## 2017-04-07 NOTE — Progress Notes (Signed)
Patient also reports pain in her lower abdomen, Dr. Maisie Fus aware and to examine, will order meds.

## 2017-04-08 LAB — BASIC METABOLIC PANEL
ANION GAP: 5 (ref 5–15)
BUN: 16 mg/dL (ref 6–20)
CHLORIDE: 104 mmol/L (ref 101–111)
CO2: 25 mmol/L (ref 22–32)
Calcium: 8.7 mg/dL — ABNORMAL LOW (ref 8.9–10.3)
Creatinine, Ser: 0.81 mg/dL (ref 0.44–1.00)
GFR calc Af Amer: >60 mL/min (ref >60)
GLUCOSE: 180 mg/dL — AB (ref 65–99)
POTASSIUM: 4.5 mmol/L (ref 3.5–5.1)
Sodium: 134 mmol/L — ABNORMAL LOW (ref 135–145)

## 2017-04-08 LAB — CBC
HCT: 33.5 % — ABNORMAL LOW (ref 35.0–47.0)
Hemoglobin: 11.3 g/dL — ABNORMAL LOW (ref 12.0–16.0)
MCH: 30.9 pg (ref 26.0–34.0)
MCHC: 33.7 g/dL (ref 32.0–36.0)
MCV: 91.5 fL (ref 80.0–100.0)
PLATELETS: 240 10*3/uL (ref 150–440)
RBC: 3.66 MIL/uL — ABNORMAL LOW (ref 3.80–5.20)
RDW: 14.5 % (ref 11.5–14.5)
WBC: 6.7 10*3/uL (ref 3.6–11.0)

## 2017-04-08 LAB — GLUCOSE, CAPILLARY
GLUCOSE-CAPILLARY: 157 mg/dL — AB (ref 65–99)
GLUCOSE-CAPILLARY: 175 mg/dL — AB (ref 65–99)
Glucose-Capillary: 228 mg/dL — ABNORMAL HIGH (ref 65–99)

## 2017-04-08 MED ORDER — CHLORHEXIDINE GLUCONATE CLOTH 2 % EX PADS
6.0000 | MEDICATED_PAD | Freq: Every day | CUTANEOUS | Status: DC
  Administered 2017-04-08: 6 via TOPICAL

## 2017-04-08 MED ORDER — SCOPOLAMINE 1 MG/3DAYS TD PT72
1.0000 | MEDICATED_PATCH | TRANSDERMAL | Status: DC
  Administered 2017-04-08: 1.5 mg via TRANSDERMAL
  Filled 2017-04-08: qty 1

## 2017-04-08 MED ORDER — MUPIROCIN 2 % EX OINT
1.0000 "application " | TOPICAL_OINTMENT | Freq: Two times a day (BID) | CUTANEOUS | Status: DC
  Administered 2017-04-08 (×2): 1 via NASAL
  Filled 2017-04-08: qty 22

## 2017-04-08 MED ORDER — TRAMADOL HCL 50 MG PO TABS
50.0000 mg | ORAL_TABLET | Freq: Four times a day (QID) | ORAL | Status: DC | PRN
  Administered 2017-04-08: 50 mg via ORAL
  Filled 2017-04-08: qty 1

## 2017-04-08 NOTE — Evaluation (Signed)
Occupational Therapy Evaluation Patient Details Name: Emily Jacobs MRN: [id number] DOB: [birth year] Today's Date: 04/08/2017    History of Present Illness Pt is a 66 y/o F who presented s/p R TKA.    Clinical Impression   Pt is 66 year old female s/p R TKA.  Pt was independent in all ADLs prior to surgery and is eager to return to PLOF.  Pt currently requires minimal assist for LB dressing while in seated position due to pain and limited AROM of R knee.  Pt would benefit from instruction and training in dressing techniques with or without assistive devices for dressing and bathing skills.  Pt would also benefit from recommendations for home modifications to increase safety in the bathroom and prevent falls. Will assess for OT [facility] needs as pt progresses in therapy but at time of evaluation, do not anticipate any home health OT needs.    Follow Up Recommendations  No OT follow up;Supervision - Intermittent (supervision for OOB)    Equipment Recommendations  3 in 1 bedside commode    Recommendations for Other Services       Precautions / Restrictions Precautions Precautions: Fall;Knee Precaution Booklet Issued: No Required Braces or Orthoses: Knee Immobilizer - Right Knee Immobilizer - Right: Other (comment) ("until discontinued") Restrictions Weight Bearing Restrictions: Yes RLE Weight Bearing: Weight bearing as tolerated      Mobility Bed Mobility               General bed mobility comments: deferred, pt up in recliner for session  Transfers Overall transfer level: Needs assistance Equipment used: Rolling walker (2 wheeled) Transfers: Sit to/from Stand Sit to Stand: Min guard         General transfer comment: VC for proper hand placement and safe technique.  Pt is steady with sit<>stand.     Balance Overall balance assessment: Needs assistance Sitting-balance support: No upper extremity supported;Feet supported Sitting balance-Leahy Scale: Good     Standing  balance support: During functional activity;Bilateral upper extremity supported Standing balance-Leahy Scale: Poor Standing balance comment: Pt relies on UE support for static and dynamic activities                           ADL either performed or assessed with clinical judgement   ADL Overall ADL's : Needs assistance/impaired Eating/Feeding: Sitting;Set up   Grooming: Sitting;Set up   Upper Body Bathing: Sitting;Set up   Lower Body Bathing: Sitting/lateral leans;Sit to/from stand;Minimal assistance Lower Body Bathing Details (indicate cue type and reason): pt educated in DME/AE to assist with LB bathing from a seated position to maximize safety and functional independence. Pt verbalized understanding. Upper Body Dressing : Sitting;Set up   Lower Body Dressing: Sitting/lateral leans;Sit to/from stand;Min guard;With adaptive equipment Lower Body Dressing Details (indicate cue type and reason): pt educated in AE for LB dressing with verbal instructions and visual demonstration. Pt verbalized understanding, would benefit from additional training to support recall and carryover of learned techniques. Pt educated in caregiver strategies for polar care mgt and compression stocking mgt. Pt verbalized understanding. Toilet Transfer: Min guard;Comfort height toilet;RW           Functional mobility during ADLs: Min guard;Rolling walker       Vision Baseline Vision/History: Wears glasses Wears Glasses: Reading only Patient Visual Report: No change from baseline Vision Assessment?: No apparent visual deficits     Perception     Praxis      Pertinent  Vitals/Pain Pain Assessment: 0-10 Pain Score: 6  Pain Location: R knee Pain Descriptors / Indicators: Aching;Grimacing;Guarding Pain Intervention(s): Limited activity within patient's tolerance;Monitored during session;Premedicated before session;Ice applied     Hand Dominance Right   Extremity/Trunk Assessment Upper  Extremity Assessment Upper Extremity Assessment: Overall WFL for tasks assessed   Lower Extremity Assessment Lower Extremity Assessment: Defer to PT evaluation;RLE deficits/detail   Cervical / Trunk Assessment Cervical / Trunk Assessment: Normal   Communication Communication Communication: No difficulties   Cognition Arousal/Alertness: Awake/alert Behavior During Therapy: WFL for tasks assessed/performed Overall Cognitive Status: Within Functional Limits for tasks assessed                                     General Comments       Exercises Other Exercises Other Exercises: pt educated in home/routines modifications to maximize safety and functional independence. Pt verbalized understanding.  Other Exercises: Pt educated in functional car transfer training with verbal instruction and visual demonstration to support safety and minimize pt's fear of falling.    Shoulder Instructions      Home Living Family/patient expects to be discharged to:: Private residence Living Arrangements: Spouse/significant other Available Help at Discharge: Family;Available PRN/intermittently Type of Home: House Home Access: Stairs to enter [employer] of Steps: 10 Entrance Stairs-Rails: Left;Right Home Layout: One level     Bathroom Shower/Tub: Producer, television/film/video: Standard Bathroom Accessibility: Yes   Home Equipment: Environmental consultant - 2 wheels;Adaptive equipment Adaptive Equipment: Reacher        Prior Functioning/Environment Level of Independence: Independent        Comments: Pt denies any falls in the past 6 months.  Was ambulating without AD.  Watches her 2 55 y/o grandchildren during the day but will not be doing this at d/c.  She was independent with all aspects of mobility.         OT Problem List: Decreased strength;Pain;Decreased range of motion;Decreased activity tolerance;Impaired balance (sitting and/or standing);Decreased knowledge of use of  DME or AE      OT Treatment/Interventions: Self-care/ADL training;DME and/or AE instruction;Patient/family education;Therapeutic activities;Therapeutic exercise    OT Goals(Current goals can be found in the care plan section) Acute Rehab OT Goals Patient Stated Goal: to go home OT Goal Formulation: With patient Time For Goal Achievement: 04/15/17 Potential to Achieve Goals: Good  OT Frequency: Min 1X/week   Barriers to D/C:            Co-evaluation              AM-PAC PT "6 Clicks" Daily Activity     Outcome Measure Help from another person eating meals?: None Help from another person taking care of personal grooming?: None Help from another person toileting, which includes using toliet, bedpan, or urinal?: A Little Help from another person bathing (including washing, rinsing, drying)?: A Little Help from another person to put on and taking off regular upper body clothing?: None Help from another person to put on and taking off regular lower body clothing?: A Little 6 Click Score: 21   End of Session Nurse Communication: Patient requests pain meds  Activity Tolerance: Patient tolerated treatment well Patient left: in chair;with call bell/phone within reach;with chair alarm set;with family/visitor present  OT Visit Diagnosis: Other abnormalities of gait and mobility (R26.89);Pain Pain - Right/Left: Right Pain - part of body: Knee  Time: 5621-3086 OT Time Calculation (min): 24 min Charges:  OT General Charges $OT Visit: 1 Visit OT Evaluation $OT Eval Low Complexity: 1 Low OT Treatments $Self Care/Home Management : 8-22 mins G-Codes: OT G-codes **NOT FOR INPATIENT CLASS** Functional Assessment Tool Used: AM-PAC 6 Clicks Daily Activity;Clinical judgement Functional Limitation: Self care Self Care Current Status (V7846): At least 20 percent but less than 40 percent impaired, limited or restricted Self Care Goal Status (N6295): At least 1 percent but  less than 20 percent impaired, limited or restricted   Richrd Prime, MPH, MS, OTR/L ascom [phone] 04/08/17, 1:36 PM

## 2017-04-08 NOTE — Progress Notes (Signed)
Physical Therapy Treatment Patient Details Name: NATALIJA MAVIS MRN: [id number] DOB: December 21, 1950 Today's Date: 04/08/2017    History of Present Illness Pt is a 66 y/o F who presented s/p R TKA.     PT Comments    Pt c/o burning stomach pain and 6/10 R knee pain. Able to perform sit<>stand x2 with CGAx1; ambulation with RW 171ft with CGAx1 and vc's for relaxing shoulders and heel strike gait pattern. End of session, pt was able to improve AAROM R knee flexion to 95 deg.  Follow Up Recommendations  Home health PT;Supervision for mobility/OOB     Equipment Recommendations  None recommended by PT    Recommendations for Other Services       Precautions / Restrictions Precautions Precautions: Fall;Knee Precaution Booklet Issued: No Required Braces or Orthoses: Knee Immobilizer - Right Knee Immobilizer - Right: Other (comment) ("until discontinued") Restrictions Weight Bearing Restrictions: Yes RLE Weight Bearing: Weight bearing as tolerated    Mobility  Bed Mobility Overal bed mobility: Needs Assistance Bed Mobility: Sit to Supine       Sit to supine: Supervision;HOB elevated   General bed mobility comments: Able to perform scoot to [facility] with supervision  Transfers Overall transfer level: Needs assistance Equipment used: Rolling walker (2 wheeled) Transfers: Sit to/from Stand Sit to Stand: Min guard         General transfer comment: steady with sit<>stand; poorly controlled descend into bed, vc's given for future transfers on slow and controlled lowering, reaching back with hands for safety  Ambulation/Gait Ambulation/Gait assistance: Min guard Ambulation Distance (Feet): 160 Feet Assistive device: Rolling walker (2 wheeled) Gait Pattern/deviations: Step-to pattern;Decreased step length - left;Decreased stance time - right;Decreased weight shift to right;Antalgic;Trunk flexed Gait velocity: decreased Gait velocity interpretation: Below normal speed for  age/gender General Gait Details: Ambulated to commode to urinate, then out in hallway; vc's to relax shoulders and for heel strike gait pattern   Stairs            Wheelchair Mobility    Modified Rankin (Stroke Patients Only)       Balance Overall balance assessment: Needs assistance Sitting-balance support: No upper extremity supported;Feet supported Sitting balance-Leahy Scale: Good     Standing balance support: During functional activity;Bilateral upper extremity supported Standing balance-Leahy Scale: Fair Standing balance comment: Pt able to stand in front of sink and wash hands without UE support                            Cognition Arousal/Alertness: Awake/alert Behavior During Therapy: WFL for tasks assessed/performed Overall Cognitive Status: Within Functional Limits for tasks assessed                                        Exercises    General Comments        Pertinent Vitals/Pain Pain Assessment: 0-10 Pain Score: 6  Pain Location: R knee, abdomen Pain Descriptors / Indicators: Aching;Grimacing;Guarding Pain Intervention(s): Limited activity within patient's tolerance;Monitored during session;Premedicated before session;Ice applied    Home Living Family/patient expects to be discharged to:: Private residence Living Arrangements: Spouse/significant other Available Help at Discharge: Family;Available PRN/intermittently Type of Home: House Home Access: Stairs to enter Entrance Stairs-Rails: Left;Right Home Layout: One level Home Equipment: Environmental consultant - 2 wheels;Adaptive equipment      Prior Function Level of Independence: Independent  Comments: Pt denies any falls in the past 6 months.  Was ambulating without AD.  Watches her 2 4 y/o grandchildren during the day but will not be doing this at d/c.  She was independent with all aspects of mobility.    PT Goals (current goals can now be found in the care plan section)  Acute Rehab PT Goals Patient Stated Goal: to go home PT Goal Formulation: With patient Time For Goal Achievement: 04/22/17 Potential to Achieve Goals: Good Progress towards PT goals: Progressing toward goals    Frequency    BID      PT Plan Current plan remains appropriate    Co-evaluation              AM-PAC PT "6 Clicks" Daily Activity  Outcome Measure  Difficulty turning over in bed (including adjusting bedclothes, sheets and blankets)?: A Little Difficulty moving from lying on back to sitting on the side of the bed? : A Lot Difficulty sitting down on and standing up from a chair with arms (e.g., wheelchair, bedside commode, etc,.)?: A Little Help needed moving to and from a bed to chair (including a wheelchair)?: A Little Help needed walking in hospital room?: A Little Help needed climbing 3-5 steps with a railing? : A Lot 6 Click Score: 16    End of Session Equipment Utilized During Treatment: Gait belt Activity Tolerance: Patient tolerated treatment well Patient left: in bed;with call bell/phone within reach;with bed alarm set;with family/visitor present;with SCD's reapplied (towel roll to elevate heels) Nurse Communication: Mobility status PT Visit Diagnosis: Pain;Unsteadiness on feet (R26.81);Other abnormalities of gait and mobility (R26.89);Muscle weakness (generalized) (M62.81) Pain - Right/Left: Right Pain - part of body: Knee     Time: 1478-2956 PT Time Calculation (min) (ACUTE ONLY): 32 min  Charges:                       G CodesSarina Ser, SPT 04/08/2017, 4:23 PM

## 2017-04-08 NOTE — Progress Notes (Signed)
Clinical Social Worker (CSW) received SNF consult. PT is recommending home health. RN case manager aware of above. Please reconsult if future social work needs arise. CSW signing off.   Dorr Perrot, LCSW [phone] 

## 2017-04-08 NOTE — Progress Notes (Signed)
   Subjective: 1 Day Post-Op Procedure(s) (LRB): TOTAL KNEE ARTHROPLASTY (Right) Patient reports pain as 7 on 0-10 scale.   Patient is well, and has had no acute complaints or problems Denies any CP, SOB, ABD pain. We will start physical therapy today.  Plan is to go Home after hospital stay.  Objective: Vital signs in last 24 hours: Temp:  [97.7 F (36.5 C)-99.2 F (37.3 C)] 99.2 F (37.3 C) (09/26 0402) Pulse Rate:  [50-92] 92 (09/26 0402) Resp:  [12-22] 18 (09/26 0402) BP: (88-154)/(46-80) 145/71 (09/26 0402) SpO2:  [93 %-100 %] 93 % (09/26 0402) Weight:  [96.6 kg (213 lb)] 96.6 kg (213 lb) (09/25 0949)  Intake/Output from previous day: 09/25 0701 - 09/26 0700 In: 922.5 [P.O.:120; I.V.:802.5] Out: 2250 [Urine:2200; Blood:50] Intake/Output this shift: No intake/output data recorded.   Recent Labs  04/07/17 1535 04/08/17 0358  HGB 12.5 11.3*    Recent Labs  04/07/17 1535 04/08/17 0358  WBC 9.0 6.7  RBC 3.98 3.66*  HCT 37.3 33.5*  PLT 232 240    Recent Labs  04/07/17 1535 04/08/17 0358  NA  --  134*  K  --  4.5  CL  --  104  CO2  --  25  BUN  --  16  CREATININE 0.87 0.81  GLUCOSE  --  180*  CALCIUM  --  8.7*   No results for input(s): LABPT, INR in the last 72 hours.  EXAM General - Patient is Alert, Appropriate and Oriented Extremity - Neurovascular intact Sensation intact distally Intact pulses distally Dorsiflexion/Plantar flexion intact No cellulitis present Compartment soft Dressing - dressing C/D/I and no drainage, wound VAC intact Motor Function - intact, moving foot and toes well on exam.   Past Medical History:  Diagnosis Date  . Anemia   . Arthritis    Osteoarthritis  . Diabetes mellitus without complication (HCC)   . Diverticulosis   . History of kidney stones   . Hypercholesteremia   . Hypertension   . PONV (postoperative nausea and vomiting)     Assessment/Plan:   1 Day Post-Op Procedure(s) (LRB): TOTAL KNEE  ARTHROPLASTY (Right) Active Problems:   Primary localized osteoarthritis of right knee  Estimated body mass index is 37.73 kg/m as calculated from the following:   Height as of this encounter:  (1.6 m).   Weight as of this encounter: 96.6 kg (213 lb). Advance diet Up with therapy  Needs bowel movement Recheck labs in the morning Acute post op blood loss anemia - Hemoglobin 11.3, stable Care management to assist with discharge.  DVT Prophylaxis - Lovenox, Foot Pumps and TED hose Weight-Bearing as tolerated to right leg   T. Cranston Neighbor, PA-C [facility] 04/08/2017, 8:01 AM

## 2017-04-08 NOTE — Evaluation (Signed)
Physical Therapy Evaluation Patient Details Name: Emily Jacobs MRN: [id number] DOB: August 31, 1950 Today's Date: 04/08/2017   History of Present Illness  Pt is a 66 y/o F who presented s/p R TKA.   Clinical Impression  Pt is s/p R TKA resulting in the deficits listed below (see PT Problem List). Mrs. Radziewicz was eager to work with therapy.  She currently requires min guard assist for bed mobility, transfers, and ambulation.  She achieved 89 deg L knee flexion AAROM.  Pt will benefit from skilled PT to increase their independence and safety with mobility to allow discharge to the venue listed below.     Follow Up Recommendations Home health PT;Supervision for mobility/OOB    Equipment Recommendations  None recommended by PT    Recommendations for Other Services       Precautions / Restrictions Precautions Precautions: Fall;Knee Precaution Booklet Issued: No Precaution Comments: Instructed pt in no pillow under knee Required Braces or Orthoses: Knee Immobilizer - Right Knee Immobilizer - Right: Other (comment) ("until discontinued") Restrictions Weight Bearing Restrictions: Yes RLE Weight Bearing: Weight bearing as tolerated      Mobility  Bed Mobility Overal bed mobility: Needs Assistance Bed Mobility: Supine to Sit     Supine to sit: Min guard;HOB elevated     General bed mobility comments: Increased time and effort with cues for sequencing.  No physical assist or cues needed.  Transfers Overall transfer level: Needs assistance Equipment used: Rolling walker (2 wheeled) Transfers: Sit to/from Stand Sit to Stand: Min guard         General transfer comment: Cues for proper hand placement and safe technique.  Pt is steady with sit<>stand.  She does require cues to back up all the way to the [facility] when preparing to sit.    Ambulation/Gait Ambulation/Gait assistance: Min guard Ambulation Distance (Feet): 20 Feet Assistive device: Rolling walker (2 wheeled) Gait  Pattern/deviations: Step-to pattern;Decreased stance time - right;Decreased step length - left;Decreased weight shift to right;Antalgic;Trunk flexed Gait velocity: decreased Gait velocity interpretation: Below normal speed for age/gender General Gait Details: Cues for proper sequencing using RW.  Pt demonstrates slightly flexed posture.  Limited ambulatory distance this session as pt requests to ambulate to commode and would like privacy and time on commode.    Stairs            Wheelchair Mobility    Modified Rankin (Stroke Patients Only)       Balance Overall balance assessment: Needs assistance Sitting-balance support: No upper extremity supported;Feet supported Sitting balance-Leahy Scale: Good     Standing balance support: During functional activity;Bilateral upper extremity supported Standing balance-Leahy Scale: Poor Standing balance comment: Pt relies on UE support for static and dynamic activities                             Pertinent Vitals/Pain Pain Assessment: 0-10 Pain Score: 8  Pain Location: R knee Pain Descriptors / Indicators: Aching;Grimacing;Guarding Pain Intervention(s): Monitored during session;Limited activity within patient's tolerance;Premedicated before session    Home Living Family/patient expects to be discharged to:: Private residence Living Arrangements: Spouse/significant other Available Help at Discharge: Family;Available PRN/intermittently (husband works down the street, available when needed) Type of Home: House Home Access: Stairs to enter Entrance Stairs-Rails: [employer] of Steps: 10 Home Layout: One level Home Equipment: Environmental consultant - 2 wheels      Prior Function Level of Independence: Independent  Comments: Pt denies any falls in the past 6 months.  Was ambulating without AD.  Watches her 2 27 y/o grandchildren during the day but will not be doing this at d/c.  She was independent with all  aspects of mobility.      Hand Dominance        Extremity/Trunk Assessment   Upper Extremity Assessment Upper Extremity Assessment: Overall WFL for tasks assessed    Lower Extremity Assessment Lower Extremity Assessment: RLE deficits/detail RLE Deficits / Details: Pt able to perform R SLR without assist.  Strength and ROM limited as expected s/p R TKA.  RLE: Unable to fully assess due to pain       Communication   Communication: No difficulties  Cognition Arousal/Alertness: Awake/alert Behavior During Therapy: WFL for tasks assessed/performed Overall Cognitive Status: Within Functional Limits for tasks assessed                                        General Comments General comments (skin integrity, edema, etc.): At end of session pt on [facility] with call bell within reach and RN in room.  Pt verbalized understanding to pull call bell once she is done on commode.  She denied any dizziness or lightheadedness.     Exercises Total Joint Exercises Ankle Circles/Pumps: AROM;Both;10 reps;Supine Quad Sets: Strengthening;Both;10 reps;Supine Straight Leg Raises: AAROM;AROM;Strengthening;Right;10 reps;Supine Knee Flexion: AAROM;Right;Seated;5 reps;Other (comment) (with 5 second holds) Goniometric ROM: L knee flexion AAROM 89 deg   Assessment/Plan    PT Assessment Patient needs continued PT services  PT Problem List Decreased strength;Decreased range of motion;Decreased activity tolerance;Decreased balance;Decreased mobility;Decreased knowledge of use of DME;Decreased safety awareness;Pain       PT Treatment Interventions DME instruction;Gait training;Stair training;Functional mobility training;Therapeutic activities;Therapeutic exercise;Balance training;Neuromuscular re-education;Patient/family education;Modalities    PT Goals (Current goals can be found in the Care Plan section)  Acute Rehab PT Goals Patient Stated Goal: to go home PT Goal Formulation: With  patient Time For Goal Achievement: 04/22/17 Potential to Achieve Goals: Good    Frequency BID   Barriers to discharge        Co-evaluation               AM-PAC PT "6 Clicks" Daily Activity  Outcome Measure Difficulty turning over in bed (including adjusting bedclothes, sheets and blankets)?: A Lot Difficulty moving from lying on back to sitting on the side of the bed? : A Lot Difficulty sitting down on and standing up from a chair with arms (e.g., wheelchair, bedside commode, etc,.)?: A Little Help needed moving to and from a bed to chair (including a wheelchair)?: A Little Help needed walking in hospital room?: A Little Help needed climbing 3-5 steps with a railing? : A Lot 6 Click Score: 15    End of Session Equipment Utilized During Treatment: Gait belt Activity Tolerance: Patient tolerated treatment well Patient left: Other (comment);with call bell/phone within reach (on [facility] with RN in room) Nurse Communication: Mobility status PT Visit Diagnosis: Pain;Unsteadiness on feet (R26.81);Other abnormalities of gait and mobility (R26.89);Muscle weakness (generalized) (M62.81) Pain - Right/Left: Right Pain - part of body: Knee    Time: 1610-9604 PT Time Calculation (min) (ACUTE ONLY): 31 min   Charges:   PT Evaluation $PT Eval Low Complexity: 1 Low PT Treatments $Therapeutic Exercise: 8-22 mins   PT G Codes:   PT G-Codes **NOT FOR INPATIENT CLASS** Functional Assessment  Tool Used: AM-PAC 6 Clicks Basic Mobility;Clinical judgement Functional Limitation: Mobility: Walking and moving around Mobility: Walking and Moving Around Current Status [phone]): At least 40 percent but less than 60 percent impaired, limited or restricted Mobility: Walking and Moving Around Goal Status [phone]): At least 1 percent but less than 20 percent impaired, limited or restricted    Encarnacion Chu PT, DPT 04/08/2017, 11:55 AM

## 2017-04-08 NOTE — Progress Notes (Signed)
OT Cancellation Note  Patient Details Name: Emily Jacobs MRN: [id number] DOB: 1950/11/07   Cancelled Treatment:    Reason Eval/Treat Not Completed: Patient declined, no reason specified. Order received, chart reviewed. Upon attempt, pt reports very nauseated, took medications this am and hasn't eaten. Per pt, RN aware and is addressing. Per pt request, brought lite applesauce for pt to trial to see if it eases her stomach. Pt requesting OT come back later in hopes that she is feels well enough to participate. Will re-attempt this afternoon.   Richrd Prime, MPH, MS, OTR/L ascom [phone] 04/08/17, 10:18 AM

## 2017-04-08 NOTE — Anesthesia Postprocedure Evaluation (Signed)
Anesthesia Post Note  Patient: Emily Jacobs  Procedure(s) Performed: Procedure(s) (LRB): TOTAL KNEE ARTHROPLASTY (Right)  Patient location during evaluation: Nursing Unit Anesthesia Type: Spinal Level of consciousness: awake, awake and alert and oriented Pain management: pain level controlled Vital Signs Assessment: post-procedure vital signs reviewed and stable Respiratory status: spontaneous breathing Cardiovascular status: blood pressure returned to baseline Postop Assessment: no headache, no backache, no apparent nausea or vomiting and adequate PO intake Anesthetic complications: no     Last Vitals:  Vitals:   04/07/17 1937 04/08/17 0402  BP: (!) 143/77 (!) 145/71  Pulse: 84 92  Resp: 18 18  Temp: 36.5 C 37.3 C  SpO2: 94% 93%    Last Pain:  Vitals:   04/08/17 0543  TempSrc:   PainSc: 5                  Elveria Lauderbaugh Lawerance Cruel

## 2017-04-08 NOTE — Care Management Note (Signed)
Case Management Note  Patient Details  Name: Emily Jacobs MRN: [id number] Date of Birth: [birth year]  Subjective/Objective:                  Met with patient to discuss discharge planning. PT was working with patient when I entered; she was sitting independently on side of bed. She states she is in the "donut hole" with her insurance and that her Lovenox might not be covered. She uses [facility] [phone] for medications. She states she has a walker at home she can use. She is not certain about which home health agency she would like to use.  Action/Plan:   Home health list provided to patient for review. PT recommendation pending. Lovenox 52m injection daily for 14 days- no refills per verbal order from Dr[facility].   Expected Discharge Date:                  Expected Discharge Plan:     In-House Referral:     Discharge planning Services  CM Consult, Medication Assistance  Post Acute Care Choice:  Home Health Choice offered to:  Patient, Adult Children  DME Arranged:    DME Agency:     HH Arranged:  PT HH Agency:     Status of Service:  In process, will continue to follow  If discussed at Long Length of Stay Meetings, dates discussed:    Additional Comments:  AMarshell Garfinkel RN 04/08/2017, 10:24 AM

## 2017-04-08 NOTE — Care Management (Addendum)
Patient picked [facility] for home health. Lovenox cost $8.31.

## 2017-04-09 LAB — BASIC METABOLIC PANEL
ANION GAP: 9 (ref 5–15)
BUN: 14 mg/dL (ref 6–20)
CO2: 24 mmol/L (ref 22–32)
Calcium: 8.9 mg/dL (ref 8.9–10.3)
Chloride: 97 mmol/L — ABNORMAL LOW (ref 101–111)
Creatinine, Ser: 0.75 mg/dL (ref 0.44–1.00)
GFR calc Af Amer: >60 mL/min (ref >60)
Glucose, Bld: 186 mg/dL — ABNORMAL HIGH (ref 65–99)
POTASSIUM: 4.3 mmol/L (ref 3.5–5.1)
SODIUM: 130 mmol/L — AB (ref 135–145)

## 2017-04-09 LAB — CBC
HEMATOCRIT: 31.4 % — AB (ref 35.0–47.0)
Hemoglobin: 10.9 g/dL — ABNORMAL LOW (ref 12.0–16.0)
MCH: 31.2 pg (ref 26.0–34.0)
MCHC: 34.7 g/dL (ref 32.0–36.0)
MCV: 89.7 fL (ref 80.0–100.0)
PLATELETS: 230 10*3/uL (ref 150–440)
RBC: 3.5 MIL/uL — ABNORMAL LOW (ref 3.80–5.20)
RDW: 14.2 % (ref 11.5–14.5)
WBC: 8.6 10*3/uL (ref 3.6–11.0)

## 2017-04-09 LAB — GLUCOSE, CAPILLARY: GLUCOSE-CAPILLARY: 189 mg/dL — AB (ref 65–99)

## 2017-04-09 MED ORDER — OXYCODONE HCL 5 MG PO TABS: 5.0000 mg | ORAL_TABLET | ORAL | 0 refills | Status: DC | PRN

## 2017-04-09 MED ORDER — ENOXAPARIN SODIUM 40 MG/0.4ML ~~LOC~~ SOLN: 40.0000 mg | SUBCUTANEOUS | 0 refills | Status: DC

## 2017-04-09 MED ORDER — TRAMADOL HCL 50 MG PO TABS: 50.0000 mg | ORAL_TABLET | Freq: Four times a day (QID) | ORAL | 0 refills | Status: DC | PRN

## 2017-04-09 NOTE — Care Management (Signed)
HHPT can do wound assessment and dressing to incision per Tim with [facility]. Home health RN not needed and therefore cancelled per [facility]'s request.

## 2017-04-09 NOTE — Progress Notes (Signed)
   Subjective: 2 Days Post-Op Procedure(s) (LRB): TOTAL KNEE ARTHROPLASTY (Right) Patient reports pain as mild.   Patient is well, and has had no acute complaints or problems Denies any CP, SOB, ABD pain. We will continue physical therapy today.  Plan is to go Home after hospital stay.  Objective: Vital signs in last 24 hours: Temp:  [98.5 F (36.9 C)-98.9 F (37.2 C)] 98.9 F (37.2 C) (09/26 1941) Pulse Rate:  [71-92] 92 (09/26 1941) Resp:  [18] 18 (09/26 1941) BP: (145-152)/(63-70) 152/70 (09/26 1941) SpO2:  [93 %-97 %] 93 % (09/26 1941)  Intake/Output from previous day: 09/26 0701 - 09/27 0700 In: 240 [P.O.:240] Out: -  Intake/Output this shift: No intake/output data recorded.   Recent Labs  04/07/17 1535 04/08/17 0358 04/09/17 0524  HGB 12.5 11.3* 10.9*    Recent Labs  04/08/17 0358 04/09/17 0524  WBC 6.7 8.6  RBC 3.66* 3.50*  HCT 33.5* 31.4*  PLT 240 230    Recent Labs  04/08/17 0358 04/09/17 0524  NA 134* 130*  K 4.5 4.3  CL 104 97*  CO2 25 24  BUN 16 14  CREATININE 0.81 0.75  GLUCOSE 180* 186*  CALCIUM 8.7* 8.9   No results for input(s): LABPT, INR in the last 72 hours.  EXAM General - Patient is Alert, Appropriate and Oriented Extremity - Neurovascular intact Sensation intact distally Intact pulses distally Dorsiflexion/Plantar flexion intact No cellulitis present Compartment soft Dressing - dressing C/D/I and no drainage, wound VAC intact Motor Function - intact, moving foot and toes well on exam.   Past Medical History:  Diagnosis Date  . Anemia   . Arthritis    Osteoarthritis  . Diabetes mellitus without complication (HCC)   . Diverticulosis   . History of kidney stones   . Hypercholesteremia   . Hypertension   . PONV (postoperative nausea and vomiting)     Assessment/Plan:   2 Days Post-Op Procedure(s) (LRB): TOTAL KNEE ARTHROPLASTY (Right) Active Problems:   Primary localized osteoarthritis of right  knee  Estimated body mass index is 37.73 kg/m as calculated from the following:   Height as of this encounter:  (1.6 m).   Weight as of this encounter: 96.6 kg (213 lb). Advance diet Up with therapy  Acute post op blood loss anemia - Hemoglobin 10.9, stable Discharge home with home health PT today pending completion of PT goals Connect  provena wound VAC at discharge  DVT Prophylaxis - Lovenox, Foot Pumps and TED hose Weight-Bearing as tolerated to right leg   T. Cranston Neighbor, PA-C [facility] 04/09/2017, 8:12 AM

## 2017-04-09 NOTE — Progress Notes (Signed)
Physical Therapy Treatment Patient Details Name: Emily Jacobs MRN: [id number] DOB: [birth year] Today's Date: 04/09/2017    History of Present Illness Pt is a 66 y/o F who presented s/p R TKA.     PT Comments    Participated in exercises as described below.  Pt was able to ambulate to/from bathroom with walker and min guard with vc's to increase safety.  After voiding, she was able to ambulate to rehab gym for stair training up/down 4 steps with bilateral rails.  She then continued to ambulate and additional 100' before requesting to return to room due to fatigue.  Overall did well and is comfortable with discharge plan.  Reviewed safety and expectations for home and recovery.   Follow Up Recommendations  Home health PT;Supervision for mobility/OOB     Equipment Recommendations  None recommended by PT    Recommendations for Other Services       Precautions / Restrictions Precautions Precautions: Fall;Knee Required Braces or Orthoses: Knee Immobilizer - Right Knee Immobilizer - Right: Other (comment) Restrictions Weight Bearing Restrictions: No RLE Weight Bearing: Weight bearing as tolerated    Mobility  Bed Mobility Overal bed mobility: Needs Assistance Bed Mobility: Sit to Supine     Supine to sit: Supervision        Transfers Overall transfer level: Needs assistance Equipment used: Rolling walker (2 wheeled)   Sit to Stand: Min guard            Ambulation/Gait Ambulation/Gait assistance: Min guard Ambulation Distance (Feet): 150 Feet Assistive device: Rolling walker (2 wheeled) Gait Pattern/deviations: Step-through pattern;Decreased step length - right;Decreased step length - left   Gait velocity interpretation: Below normal speed for age/gender General Gait Details: irregular pattern   Stairs Stairs: Yes   Stair Management: Two rails Number of Stairs: 4 General stair comments: verbal cues for sequencing review but overall does well.  Wheelchair  Mobility    Modified Rankin (Stroke Patients Only)       Balance Overall balance assessment: Needs assistance Sitting-balance support: No upper extremity supported;Feet supported Sitting balance-Leahy Scale: Good     Standing balance support: During functional activity;Bilateral upper extremity supported Standing balance-Leahy Scale: Fair Standing balance comment: Pt able to stand in front of sink and wash hands without UE support                            Cognition Arousal/Alertness: Awake/alert Behavior During Therapy: WFL for tasks assessed/performed Overall Cognitive Status: Within Functional Limits for tasks assessed                                        Exercises Total Joint Exercises Ankle Circles/Pumps: AROM;Both;Supine;20 reps Quad Sets: Strengthening;Both;10 reps;Supine Short Arc Quad: AROM;Strengthening;Supine;Right;10 reps Heel Slides: AAROM;Strengthening;Supine;Right;10 reps Hip ABduction/ADduction: AAROM;Strengthening;Supine;Right;10 reps Straight Leg Raises: AAROM;Strengthening;Supine;Right;10 reps Long Arc Quad: AROM;Seated;Strengthening;Right;10 reps Knee Flexion: AAROM;Strengthening;Right;Seated;5 reps Goniometric ROM: 0-95 degrees AAROM Other Exercises Other Exercises: verbal review of expectations during TKR recovery    General Comments        Pertinent Vitals/Pain Pain Assessment: 0-10 Pain Score: 4  Pain Location: R knee Pain Descriptors / Indicators: Aching Pain Intervention(s): Limited activity within patient's tolerance;Monitored during session;Premedicated before session;Ice applied    Home Living  Prior Function            PT Goals (current goals can now be found in the care plan section) Progress towards PT goals: Progressing toward goals    Frequency    BID      PT Plan Current plan remains appropriate    Co-evaluation              AM-PAC PT "6 Clicks"  Daily Activity  Outcome Measure  Difficulty turning over in bed (including adjusting bedclothes, sheets and blankets)?: A Little Difficulty moving from lying on back to sitting on the side of the bed? : A Little Difficulty sitting down on and standing up from a chair with arms (e.g., wheelchair, bedside commode, etc,.)?: A Little Help needed moving to and from a bed to chair (including a wheelchair)?: A Little Help needed walking in hospital room?: A Little Help needed climbing 3-5 steps with a railing? : A Little 6 Click Score: 18    End of Session Equipment Utilized During Treatment: Gait belt Activity Tolerance: Patient tolerated treatment well Patient left: in chair;with chair alarm set;with call bell/phone within reach;with family/visitor present   Pain - Right/Left: Right Pain - part of body: Knee     Time: 9147-8295 PT Time Calculation (min) (ACUTE ONLY): 30 min  Charges:  $Gait Training: 8-22 mins $Therapeutic Exercise: 8-22 mins                    G Codes:       Danielle Dess, PTA 04/09/17, 9:36 AM

## 2017-04-09 NOTE — Progress Notes (Signed)
Occupational Therapy Treatment Patient Details Name: Emily Jacobs MRN: [id number] DOB: 1950/10/17 Today's Date: 04/09/2017    History of present illness Pt is a 66 y/o F who presented s/p R TKA.    OT comments  Pt seen for OT treatment this date. Daughter present for portion of session. Pt/daughter educated in DME to facilitate increased safety/independence with bed transfers, as pt reports having a tall bed and prior would have to "plop" in or climb in. Pt/daughter shown examples on computer of step platforms and visual demonstration of safe technique. PT/daughter verbalized understanding. Pt progressing towards goals, with plan to discharge home today.   Follow Up Recommendations  No OT follow up;Supervision - Intermittent (supervision for OOB)    Equipment Recommendations  3 in 1 bedside commode    Recommendations for Other Services      Precautions / Restrictions Precautions Precautions: Fall;Knee Precaution Booklet Issued: No Required Braces or Orthoses: Knee Immobilizer - Right Knee Immobilizer - Right: Other (comment) Restrictions Weight Bearing Restrictions: No RLE Weight Bearing: Weight bearing as tolerated       Mobility Bed Mobility     General bed mobility comments: deferred, pt up in recliner for session  Transfers              Balance Overall balance assessment: Needs assistance Sitting-balance support: No upper extremity supported;Feet supported Sitting balance-Leahy Scale: Good                                ADL either performed or assessed with clinical judgement   ADL                                               Vision Baseline Vision/History: Wears glasses Wears Glasses: Reading only Patient Visual Report: No change from baseline Vision Assessment?: No apparent visual deficits   Perception     Praxis      Cognition Arousal/Alertness: Awake/alert Behavior During Therapy: WFL for tasks  assessed/performed Overall Cognitive Status: Within Functional Limits for tasks assessed                                          Exercises Other Exercises Other Exercises: Pt/daughter educated in DME to facilitate increased safety/independence with bed transfers, as pt reports having a tall bed and prior would have to "plop" in or climb in. Pt/daughter shown examples on computer of step platforms and visual demonstration of safe technique. PT/daughter verbalized understanding.   Shoulder Instructions       General Comments      Pertinent Vitals/ Pain       Pain Assessment: 0-10 Pain Score: 4  Pain Location: R knee Pain Descriptors / Indicators: Aching Pain Intervention(s): Limited activity within patient's tolerance;Monitored during session;Premedicated before session;Ice applied  Home Living                                          Prior Functioning/Environment              Frequency  Min 1X/week        Progress Toward Goals  OT Goals(current goals can now be found in the care plan section)  Progress towards OT goals: Progressing toward goals  Acute Rehab OT Goals Patient Stated Goal: to go home OT Goal Formulation: With patient  Plan Discharge plan remains appropriate;Frequency remains appropriate    Co-evaluation                 AM-PAC PT "6 Clicks" Daily Activity     Outcome Measure   Help from another person eating meals?: None Help from another person taking care of personal grooming?: None Help from another person toileting, which includes using toliet, bedpan, or urinal?: A Little Help from another person bathing (including washing, rinsing, drying)?: A Little Help from another person to put on and taking off regular upper body clothing?: None Help from another person to put on and taking off regular lower body clothing?: A Little 6 Click Score: 21    End of Session    OT Visit Diagnosis: Other  abnormalities of gait and mobility (R26.89);Pain Pain - Right/Left: Right Pain - part of body: Knee   Activity Tolerance Patient tolerated treatment well   Patient Left in chair;with call bell/phone within reach;with chair alarm set;with family/visitor present   Nurse Communication          Time: 1324-4010 OT Time Calculation (min): 14 min  Charges: OT General Charges $OT Visit: 1 Visit OT Treatments $Self Care/Home Management : 8-22 mins  Richrd Prime, MPH, MS, OTR/L ascom [phone] 04/09/17, 10:36 AM

## 2017-04-09 NOTE — Discharge Summary (Signed)
Physician Discharge Summary  Patient ID: LEKISHA MCGHEE MRN: [id number] DOB/AGE: 66-30-1952 66 y.o.  Admit date: 04/07/2017 Discharge date: 04/09/2017  Admission Diagnoses:  PRIMARY OSTEOARTHRITIS OF RIGHT KNEE   Discharge Diagnoses: Patient Active Problem List   Diagnosis Date Noted  . Primary localized osteoarthritis of right knee 04/07/2017    Past Medical History:  Diagnosis Date  . Anemia   . Arthritis    Osteoarthritis  . Diabetes mellitus without complication (HCC)   . Diverticulosis   . History of kidney stones   . Hypercholesteremia   . Hypertension   . PONV (postoperative nausea and vomiting)      Transfusion: none   Consultants (if any):   Discharged Condition: Improved  Hospital Course: FATEN FRIESON is an 66 y.o. female who was admitted 04/07/2017 with a diagnosis of right knee osteoarthritis and went to the operating room on 04/07/2017 and underwent the above named procedures.    Surgeries: Procedure(s): TOTAL KNEE ARTHROPLASTY on 04/07/2017 Patient tolerated the surgery well. Taken to PACU where she was stabilized and then transferred to the orthopedic floor.  Started on Lovenox 30 q 12 hrs. Foot pumps applied bilaterally at 80 mm. Heels elevated on bed with rolled towels. No evidence of DVT. Negative Homan. Physical therapy started on day #1 for gait training and transfer. OT started day #1 for ADL and assisted devices.  Patient's foley was d/c on day #1. Patient's IV was d/c on day #2.  On post op day #2 patient was stable and ready for discharge to home with HHPT.  Implants:  Medacta GMK sphere 3 femur 3 tibia was short stem and 12 mm insert, to patella all components cemented  She was given perioperative antibiotics:  Anti-infectives    Start     Dose/Rate Route Frequency Ordered Stop   04/07/17 1730  ceFAZolin (ANCEF) 2 g in dextrose 5 % 100 mL injection      Intravenous Every 6 hours 04/07/17 1524 04/08/17 0539   04/07/17 1445  ceFAZolin (ANCEF)  IVPB 2g/100 mL premix  Status:  Discontinued     2 g 200 mL/hr over 30 Minutes Intravenous Every 6 hours 04/07/17 1434 04/07/17 1523   04/07/17 1007  ceFAZolin (ANCEF) 2-4 GM/100ML-% IVPB    Comments:  Lorrene Reid   : cabinet override      04/07/17 1007 04/07/17 1133   04/07/17 0045  ceFAZolin (ANCEF) IVPB 2g/100 mL premix     2 g 200 mL/hr over 30 Minutes Intravenous  Once 04/07/17 0042 04/07/17 1143    .  She was given sequential compression devices, early ambulation, and Lovenox for DVT prophylaxis.  She benefited maximally from the hospital stay and there were no complications.    Recent vital signs:  Vitals:   04/08/17 1622 04/08/17 1941  BP: (!) 145/66 (!) 152/70  Pulse: 83 92  Resp:  18  Temp: 98.6 F (37 C) 98.9 F (37.2 C)  SpO2: 93% 93%    Recent laboratory studies:  Lab Results  Component Value Date   HGB 10.9 (L) 04/09/2017   HGB 11.3 (L) 04/08/2017   HGB 12.5 04/07/2017   Lab Results  Component Value Date   WBC 8.6 04/09/2017   PLT 230 04/09/2017   Lab Results  Component Value Date   INR 0.97 03/25/2017   Lab Results  Component Value Date   NA 130 (L) 04/09/2017   K 4.3 04/09/2017   CL 97 (L) 04/09/2017   CO2 24 04/09/2017  BUN 14 04/09/2017   CREATININE 0.75 04/09/2017   GLUCOSE 186 (H) 04/09/2017    Discharge Medications:   Allergies as of 04/09/2017      Reactions   Amlodipine Other (See Comments)   Headache   Other Other (See Comments)   Corn, tea, and eggs: nasal congestion   Codeine Nausea And Vomiting      Medication List    TAKE these medications   acetaminophen 650 MG CR tablet Commonly known as:  TYLENOL Take 650 mg by mouth every 8 (eight) hours as needed for pain.   aspirin EC 81 MG tablet Take 81 mg by mouth daily.   enoxaparin 40 MG/0.4ML injection Commonly known as:  LOVENOX Inject 0.4 mLs (40 mg total) into the skin daily.   glipiZIDE 10 MG 24 hr tablet Commonly known as:  GLUCOTROL XL Take 20 mg by  mouth daily before breakfast.   JANUVIA 100 MG tablet Generic drug:  sitaGLIPtin Take 100 mg by mouth daily.   lisinopril-hydrochlorothiazide 20-25 MG tablet Commonly known as:  PRINZIDE,ZESTORETIC Take 1.5 tablets by mouth daily.   MEGARED OMEGA-3 KRILL OIL 500 MG Caps Take 500 mg by mouth daily.   metFORMIN 1000 MG tablet Commonly known as:  GLUCOPHAGE Take 1,000 mg by mouth 2 (two) times daily.   metoprolol succinate 25 MG 24 hr tablet Commonly known as:  TOPROL-XL Take 25 mg by mouth daily.   oxyCODONE 5 MG immediate release tablet Commonly known as:  Oxy IR/ROXICODONE Take 1-2 tablets (5-10 mg total) by mouth every 3 (three) hours as needed for breakthrough pain.   pioglitazone 45 MG tablet Commonly known as:  ACTOS Take 45 mg by mouth daily.   simvastatin 20 MG tablet Commonly known as:  ZOCOR Take 20 mg by mouth at bedtime.   traMADol 50 MG tablet Commonly known as:  ULTRAM Take 1-2 tablets (50-100 mg total) by mouth every 6 (six) hours as needed for severe pain.   Vitamin D 2000 units Caps Take 2,000 Units by mouth daily.            Durable Medical Equipment        Start     Ordered   04/07/17 1435  DME Walker rolling  Once    Question:  Patient needs a walker to treat with the following condition  Answer:  S/P TKR (total knee replacement), right   04/07/17 1434   04/07/17 1435  DME 3 n 1  Once     04/07/17 1434   04/07/17 1435  DME Bedside commode  Once    Question:  Patient needs a bedside commode to treat with the following condition  Answer:  S/P total knee replacement   04/07/17 1434       Discharge Care Instructions        Start     Ordered   04/09/17 0000  oxyCODONE (OXY IR/ROXICODONE) 5 MG immediate release tablet  Every  3 hours PRN    Question:  Supervising Provider  Answer:  Kennedy Bucker   04/09/17 0816   04/09/17 0000  traMADol (ULTRAM) 50 MG tablet  Every 6 hours PRN    Question:  Supervising Provider  Answer:  Kennedy Bucker    04/09/17 0816   04/09/17 0000  enoxaparin (LOVENOX) 40 MG/0.4ML injection  Every 24 hours    Question:  Supervising Provider  Answer:  Kennedy Bucker   04/09/17 0816      Diagnostic Studies: Dg Knee 1-2 Views Right  Result Date: 04/07/2017 CLINICAL DATA:  Status post total right knee joint replacement. EXAM: RIGHT KNEE - 1-2 VIEW COMPARISON:  CT scan of the right knee of March 18, 2017 FINDINGS: The patient has undergone right total knee joint prosthesis placement. Radiographic positioning of the prosthetic components is good. The interface with the native bone appears normal. The small amount of air in fluid is present in the anterior aspect of the joint space. Surgical skin staples are present. IMPRESSION: There is no postprocedure complication following right total knee joint prosthesis placement. Electronically Signed   By: David  [place] M.D.   On: 04/07/2017 13:32   Ct Knee Right Wo Contrast  Result Date: 03/18/2017 CLINICAL DATA:  Preoperative planning study in patient for right knee replacement. MY KNEE protocol. EXAM: CT OF THE RIGHT KNEE WITHOUT CONTRAST TECHNIQUE: Multidetector CT imaging of the right knee was performed according to the standard protocol. Multiplanar CT image reconstructions were also generated. Axial imaging only of the right hip and ankle was also performed. COMPARISON:  None. FINDINGS: Bones/Joint/Cartilage The patient has advanced osteoarthritis about the knee which is worst in the medial compartment where there is bone-on-bone joint space narrowing. No acute bony or joint abnormality is identified. Very small joint effusion is noted. Axial imaging only of the right hip and ankle demonstrates no acute abnormality. Subcutaneous edema about the ankle is noted. Ligaments Suboptimally assessed by CT. Muscles and Tendons Intact. Soft tissues Negative. IMPRESSION: Advanced osteoarthritis of the right knee is worst in the medial compartment. No acute abnormality.  Electronically Signed   By: Drusilla Kanner M.D.   On: 03/18/2017 13:25    Disposition: Final discharge disposition not confirmed    Follow-up Information    Kennedy Bucker, MD. Go in 2 week(s).   Specialty:  Orthopedic Surgery Contact information: [address] [place] [address] [phone]            Signed: Patience Musca 04/09/2017, 8:18 AM

## 2017-04-09 NOTE — Care Management (Addendum)
RNCM has notified Emily Jacobs with [facility] of patient discharge today. No other RNCM needs. Patient requesting a bedside commode- delivered per [facility] with [facility].

## 2017-04-09 NOTE — Progress Notes (Signed)
Patient is being discharged home. Prevena applied. IV remvoed with cath intact. Patient able to give Lovenox injection. Reviewed activity, scripts, last dose given, all time for questions. Extra dressing sent with patient. Allowed time for questions.

## 2017-04-09 NOTE — Discharge Instructions (Signed)

## 2017-11-19 ENCOUNTER — Other Ambulatory Visit: Payer: Self-pay | Admitting: Family Medicine

## 2017-11-19 DIAGNOSIS — Z780 Asymptomatic menopausal state: Secondary | ICD-10-CM

## 2020-11-29 ENCOUNTER — Other Ambulatory Visit: Payer: Self-pay | Admitting: Orthopedic Surgery

## 2020-11-29 DIAGNOSIS — M1712 Unilateral primary osteoarthritis, left knee: Secondary | ICD-10-CM

## 2020-12-13 ENCOUNTER — Other Ambulatory Visit: Payer: Self-pay

## 2020-12-13 ENCOUNTER — Ambulatory Visit
Admission: RE | Admit: 2020-12-13 | Discharge: 2020-12-13 | Disposition: A | Discharge status: Home / Self Care | Payer: Medicare Other | Source: Ambulatory Visit | Attending: Orthopedic Surgery | Admitting: Orthopedic Surgery

## 2020-12-13 DIAGNOSIS — M1712 Unilateral primary osteoarthritis, left knee: Secondary | ICD-10-CM | POA: Diagnosis present

## 2021-01-07 ENCOUNTER — Other Ambulatory Visit: Payer: Self-pay | Admitting: Orthopedic Surgery

## 2021-01-18 ENCOUNTER — Encounter
Admission: RE | Admit: 2021-01-18 | Discharge: 2021-01-18 | Disposition: A | Discharge status: Home / Self Care | Payer: Medicare Other | Source: Ambulatory Visit | Attending: Orthopedic Surgery | Admitting: Orthopedic Surgery

## 2021-01-18 ENCOUNTER — Other Ambulatory Visit: Payer: Self-pay

## 2021-01-18 DIAGNOSIS — Z01818 Encounter for other preprocedural examination: Secondary | ICD-10-CM | POA: Diagnosis present

## 2021-01-18 DIAGNOSIS — Z01810 Encounter for preprocedural cardiovascular examination: Secondary | ICD-10-CM | POA: Diagnosis not present

## 2021-01-18 LAB — COMPREHENSIVE METABOLIC PANEL
ALT: 12 U/L (ref 0–44)
AST: 19 U/L (ref 15–41)
Albumin: 4.2 g/dL (ref 3.5–5.0)
Alkaline Phosphatase: 33 U/L — ABNORMAL LOW (ref 38–126)
Anion gap: 8 (ref 5–15)
BUN: 34 mg/dL — ABNORMAL HIGH (ref 8–23)
CO2: 24 mmol/L (ref 22–32)
Calcium: 9.7 mg/dL (ref 8.9–10.3)
Chloride: 104 mmol/L (ref 98–111)
Creatinine, Ser: 1.05 mg/dL — ABNORMAL HIGH (ref 0.44–1.00)
GFR, Estimated: 57 mL/min — ABNORMAL LOW (ref >60)
Glucose, Bld: 103 mg/dL — ABNORMAL HIGH (ref 70–99)
Potassium: 5.1 mmol/L (ref 3.5–5.1)
Sodium: 136 mmol/L (ref 135–145)
Total Bilirubin: 0.8 mg/dL (ref 0.3–1.2)
Total Protein: 7.6 g/dL (ref 6.5–8.1)

## 2021-01-18 LAB — URINALYSIS, ROUTINE W REFLEX MICROSCOPIC
Bilirubin Urine: NEGATIVE
Glucose, UA: NEGATIVE mg/dL
Hgb urine dipstick: NEGATIVE
Ketones, ur: NEGATIVE mg/dL
Leukocytes,Ua: NEGATIVE
Nitrite: NEGATIVE
Protein, ur: NEGATIVE mg/dL
Specific Gravity, Urine: 1.005 (ref 1.005–1.030)
pH: 7 (ref 5.0–8.0)

## 2021-01-18 LAB — CBC WITH DIFFERENTIAL/PLATELET
Abs Immature Granulocytes: 0.05 10*3/uL (ref 0.00–0.07)
Basophils Absolute: 0 10*3/uL (ref 0.0–0.1)
Basophils Relative: 1 %
Eosinophils Absolute: 0.1 10*3/uL (ref 0.0–0.5)
Eosinophils Relative: 2 %
HCT: 37.2 % (ref 36.0–46.0)
Hemoglobin: 12.2 g/dL (ref 12.0–15.0)
Immature Granulocytes: 1 %
Lymphocytes Relative: 28 %
Lymphs Abs: 1.7 10*3/uL (ref 0.7–4.0)
MCH: 30.9 pg (ref 26.0–34.0)
MCHC: 32.8 g/dL (ref 30.0–36.0)
MCV: 94.2 fL (ref 80.0–100.0)
Monocytes Absolute: 0.5 10*3/uL (ref 0.1–1.0)
Monocytes Relative: 9 %
Neutro Abs: 3.5 10*3/uL (ref 1.7–7.7)
Neutrophils Relative %: 59 %
Platelets: 253 10*3/uL (ref 150–400)
RBC: 3.95 MIL/uL (ref 3.87–5.11)
RDW: 13.9 % (ref 11.5–15.5)
WBC: 5.9 10*3/uL (ref 4.0–10.5)
nRBC: 0 % (ref 0.0–0.2)

## 2021-01-18 LAB — SURGICAL PCR SCREEN
MRSA, PCR: NEGATIVE
Staphylococcus aureus: POSITIVE — AB

## 2021-01-18 NOTE — Patient Instructions (Signed)
Your procedure is scheduled on: 01/29/2021 Report to the Registration Desk on the 1st floor of the [facility]. To find out your arrival time, please call [phone] between 1PM - 3PM on: 01/28/2021  REMEMBER: Instructions that are not followed completely may result in serious medical risk, up to and including death; or upon the discretion of your surgeon and anesthesiologist your surgery may need to be rescheduled.  Do not eat food after midnight the night before surgery.  No gum chewing, lozengers or hard candies.  You may however, drink water liquids up to 2 hours before you are scheduled to arrive for your surgery. Do not drink anything within 2 hours of your scheduled arrival time.  In addition, your doctor has ordered for you to drink the provided  Gatorade G2 Drinking this drink up to two hours before surgery helps to reduce insulin resistance and improve patient outcomes. Please complete drinking 2 hours prior to scheduled arrival time.  TAKE THESE MEDICATIONS THE MORNING OF SURGERY WITH A SIP OF WATER: - Metoprolol  Stop Metformin and Janumet 2 days prior to surgery. Last dose may be taken on 01/26/2021  Follow recommendations from Cardiologist, Pulmonologist or PCP regarding stopping Aspirin, Coumadin, Plavix, Eliquis, Pradaxa, or Pletal.  One week prior to surgery: Stop Anti-inflammatories (NSAIDS) such as Advil, Aleve, Ibuprofen, Motrin, Naproxen, Naprosyn and Aspirin based products such as Excedrin, Goodys Powder, BC Powder. You may however, continue to take Tylenol if needed for pain up until the day of surgery. Stop ANY OVER THE COUNTER supplements until after surgery.  No Alcohol for 24 hours before or after surgery.  No Smoking including e-cigarettes for 24 hours prior to surgery.  No chewable tobacco products for at least 6 hours prior to surgery.  No nicotine patches on the day of surgery.  Do not use any "recreational" drugs for at least a week prior to your  surgery.  Please be advised that the combination of cocaine and anesthesia may have negative outcomes, up to and including death. If you test positive for cocaine, your surgery will be cancelled.  On the morning of surgery brush your teeth with toothpaste and water, you may rinse your mouth with mouthwash if you wish. Do not swallow any toothpaste or mouthwash.  Do not wear lotions, powders, or perfumes.   Do not shave body from the neck down 48 hours prior to surgery just in case you cut yourself which could leave a site for infection.  Also, freshly shaved skin may become irritated if using the CHG soap.  Contact lenses, hearing aids and dentures may not be worn into surgery.  Do not bring valuables to the hospital. [facility] is not responsible for any missing/lost belongings or valuables.   Use CHG Soap or wipes as directed on instruction sheet.  Notify your doctor if there is any change in your medical condition (cold, fever, infection).  Wear comfortable clothing (specific to your surgery type) to the hospital.  After surgery, you can help prevent lung complications by doing breathing exercises.  Take deep breaths and cough every 1-2 hours. Your doctor may order a device called an Incentive Spirometer to help you take deep breaths. When coughing or sneezing, hold a pillow firmly against your incision with both hands. This is called "splinting." Doing this helps protect your incision. It also decreases belly discomfort.  If you are being admitted to the hospital overnight, leave your suitcase in the car. After surgery it may be brought to  your room.  Please call the Pre-admissions Testing Dept. at [phone] if you have any questions about these instructions.  Surgery Visitation Policy:  Patients undergoing a surgery or procedure may have one family member or support person with them as long as that person is not COVID-19 positive or experiencing its symptoms.  That person  may remain in the waiting area during the procedure.  Inpatient Visitation:    Visiting hours are 7 a.m. to 8 p.m. Inpatients will be allowed two visitors daily. The visitors may change each day during the patient's stay. No visitors under the age of 5. Any visitor under the age of 47 must be accompanied by an adult. The visitor must pass COVID-19 screenings, use hand sanitizer when entering and exiting the patient's room and wear a mask at all times, including in the patient's room. Patients must also wear a mask when staff or their visitor are in the room. Masking is required regardless of vaccination status.

## 2021-01-18 NOTE — Patient Instructions (Addendum)
Your procedure is scheduled on: 01/29/2021 Report to the Registration Desk on the 1st floor of the [facility]. To find out your arrival time, please call [phone] between 1PM - 3PM on: 01/28/2021  REMEMBER: Instructions that are not followed completely may result in serious medical risk, up to and including death; or upon the discretion of your surgeon and anesthesiologist your surgery may need to be rescheduled.  Do not eat food after midnight the night before surgery.  No gum chewing, lozengers or hard candies.  You may however, drink water liquids up to 2 hours before you are scheduled to arrive for your surgery. Do not drink anything within 2 hours of your scheduled arrival time.  In addition, your doctor has ordered for you to drink the provided  Gatorade G2 Drinking this drink up to two hours before surgery helps to reduce insulin resistance and improve patient outcomes. Please complete drinking 2 hours prior to scheduled arrival time.  TAKE THESE MEDICATIONS THE MORNING OF SURGERY WITH A SIP OF WATER: - Metoprolol 25 MG  Stop Metformin and Janumet 2 days prior to surgery. Last dose may be taken on 01/26/2021  Follow recommendations from Cardiologist, Pulmonologist or PCP regarding stopping Aspirin, Coumadin, Plavix, Eliquis, Pradaxa, or Pletal.  One week prior to surgery: Stop Anti-inflammatories (NSAIDS) such as Advil, Aleve, Ibuprofen, Motrin, Naproxen, Naprosyn and Aspirin based products such as Excedrin, Goodys Powder, BC Powder. You may however, continue to take Tylenol if needed for pain up until the day of surgery. Stop ANY OVER THE COUNTER vitamins and supplements until after surgery.   No Alcohol for 24 hours before or after surgery.  No Smoking including e-cigarettes for 24 hours prior to surgery.  No chewable tobacco products for at least 6 hours prior to surgery.  No nicotine patches on the day of surgery.  Do not use any "recreational" drugs for at least  a week prior to your surgery.  Please be advised that the combination of cocaine and anesthesia may have negative outcomes, up to and including death. If you test positive for cocaine, your surgery will be cancelled.  On the morning of surgery brush your teeth with toothpaste and water, you may rinse your mouth with mouthwash if you wish. Do not swallow any toothpaste or mouthwash.  Do not wear jewelry, make-up, hairpins, clips or nail polish.  Do not wear lotions, powders, or perfumes.   Do not shave body from the neck down 48 hours prior to surgery just in case you cut yourself which could leave a site for infection.  Also, freshly shaved skin may become irritated if using the CHG soap.  Contact lenses, hearing aids and dentures may not be worn into surgery.  Do not bring valuables to the hospital. [facility] is not responsible for any missing/lost belongings or valuables.   Use CHG Soap or wipes as directed on instruction sheet.  Notify your doctor if there is any change in your medical condition (cold, fever, infection).  Wear comfortable clothing (specific to your surgery type) to the hospital.  After surgery, you can help prevent lung complications by doing breathing exercises.  Take deep breaths and cough every 1-2 hours. Your doctor may order a device called an Incentive Spirometer to help you take deep breaths. When coughing or sneezing, hold a pillow firmly against your incision with both hands. This is called "splinting." Doing this helps protect your incision. It also decreases belly discomfort.  If you are being admitted to  the hospital overnight, leave your suitcase in the car. After surgery it may be brought to your room.  Please call the Pre-admissions Testing Dept. at [phone] if you have any questions about these instructions.  Surgery Visitation Policy:  Patients undergoing a surgery or procedure may have one family member or support person with them as  long as that person is not COVID-19 positive or experiencing its symptoms.  That person may remain in the waiting area during the procedure.  Inpatient Visitation:    Visiting hours are 7 a.m. to 8 p.m. Inpatients will be allowed two visitors daily. The visitors may change each day during the patient's stay. No visitors under the age of 35. Any visitor under the age of 14 must be accompanied by an adult. The visitor must pass COVID-19 screenings, use hand sanitizer when entering and exiting the patient's room and wear a mask at all times, including in the patient's room. Patients must also wear a mask when staff or their visitor are in the room. Masking is required regardless of vaccination status.

## 2021-01-23 LAB — TYPE AND SCREEN
ABO/RH(D): O POS
Antibody Screen: NEGATIVE

## 2021-01-25 ENCOUNTER — Other Ambulatory Visit
Admission: RE | Admit: 2021-01-25 | Discharge: 2021-01-25 | Disposition: A | Discharge status: Home / Self Care | Payer: Medicare Other | Source: Ambulatory Visit | Attending: Orthopedic Surgery | Admitting: Orthopedic Surgery

## 2021-01-25 ENCOUNTER — Other Ambulatory Visit: Payer: Self-pay

## 2021-01-25 DIAGNOSIS — Z01812 Encounter for preprocedural laboratory examination: Secondary | ICD-10-CM | POA: Diagnosis present

## 2021-01-25 DIAGNOSIS — Z20822 Contact with and (suspected) exposure to covid-19: Secondary | ICD-10-CM | POA: Insufficient documentation

## 2021-01-25 LAB — SARS CORONAVIRUS 2 (TAT 6-24 HRS): SARS Coronavirus 2: NEGATIVE

## 2021-01-28 MED ORDER — APREPITANT 40 MG PO CAPS: 40.0000 mg | ORAL_CAPSULE | Freq: Once | ORAL | Status: AC

## 2021-01-28 MED ORDER — SODIUM CHLORIDE 0.9 % IV SOLN
INTRAVENOUS | Status: DC
Start: 2021-01-28 — End: 2021-01-29

## 2021-01-28 MED ORDER — FAMOTIDINE 20 MG PO TABS: 20.0000 mg | ORAL_TABLET | Freq: Once | ORAL | Status: AC

## 2021-01-28 MED ORDER — CHLORHEXIDINE GLUCONATE 0.12 % MT SOLN: 15.0000 mL | Freq: Once | OROMUCOSAL | Status: AC

## 2021-01-28 MED ORDER — CEFAZOLIN SODIUM-DEXTROSE 2-4 GM/100ML-% IV SOLN
2.0000 g | INTRAVENOUS | Status: AC
  Administered 2021-01-29: 2 g via INTRAVENOUS

## 2021-01-28 MED ORDER — ORAL CARE MOUTH RINSE: 15.0000 mL | Freq: Once | OROMUCOSAL | Status: AC

## 2021-01-29 ENCOUNTER — Encounter: Payer: Self-pay | Admitting: Orthopedic Surgery

## 2021-01-29 ENCOUNTER — Encounter
Admission: RE | Disposition: A | Discharge status: Home Health | Payer: Self-pay | Source: Home / Self Care | Attending: Orthopedic Surgery

## 2021-01-29 ENCOUNTER — Other Ambulatory Visit: Payer: Self-pay

## 2021-01-29 ENCOUNTER — Inpatient Hospital Stay
Admission: RE | Admit: 2021-01-29 | Discharge: 2021-01-31 | DRG: 470 | Disposition: A | Discharge status: Home Health | Payer: Medicare Other | Attending: Orthopedic Surgery | Admitting: Orthopedic Surgery

## 2021-01-29 ENCOUNTER — Inpatient Hospital Stay: Payer: Medicare Other

## 2021-01-29 DIAGNOSIS — Z91018 Allergy to other foods: Secondary | ICD-10-CM

## 2021-01-29 DIAGNOSIS — Z888 Allergy status to other drugs, medicaments and biological substances status: Secondary | ICD-10-CM

## 2021-01-29 DIAGNOSIS — I129 Hypertensive chronic kidney disease with stage 1 through stage 4 chronic kidney disease, or unspecified chronic kidney disease: Secondary | ICD-10-CM | POA: Diagnosis present

## 2021-01-29 DIAGNOSIS — Z96651 Presence of right artificial knee joint: Secondary | ICD-10-CM | POA: Diagnosis present

## 2021-01-29 DIAGNOSIS — Z8249 Family history of ischemic heart disease and other diseases of the circulatory system: Secondary | ICD-10-CM | POA: Diagnosis not present

## 2021-01-29 DIAGNOSIS — Z96652 Presence of left artificial knee joint: Secondary | ICD-10-CM

## 2021-01-29 DIAGNOSIS — E1122 Type 2 diabetes mellitus with diabetic chronic kidney disease: Secondary | ICD-10-CM | POA: Diagnosis present

## 2021-01-29 DIAGNOSIS — N1831 Chronic kidney disease, stage 3a: Secondary | ICD-10-CM | POA: Diagnosis present

## 2021-01-29 DIAGNOSIS — Z86010 Personal history of colonic polyps: Secondary | ICD-10-CM

## 2021-01-29 DIAGNOSIS — Z7984 Long term (current) use of oral hypoglycemic drugs: Secondary | ICD-10-CM | POA: Diagnosis not present

## 2021-01-29 DIAGNOSIS — Z87442 Personal history of urinary calculi: Secondary | ICD-10-CM

## 2021-01-29 DIAGNOSIS — Z91012 Allergy to eggs: Secondary | ICD-10-CM | POA: Diagnosis not present

## 2021-01-29 DIAGNOSIS — Z7982 Long term (current) use of aspirin: Secondary | ICD-10-CM | POA: Diagnosis not present

## 2021-01-29 DIAGNOSIS — E7800 Pure hypercholesterolemia, unspecified: Secondary | ICD-10-CM | POA: Diagnosis present

## 2021-01-29 DIAGNOSIS — Z90710 Acquired absence of both cervix and uterus: Secondary | ICD-10-CM | POA: Diagnosis not present

## 2021-01-29 DIAGNOSIS — Z885 Allergy status to narcotic agent status: Secondary | ICD-10-CM | POA: Diagnosis not present

## 2021-01-29 DIAGNOSIS — M1712 Unilateral primary osteoarthritis, left knee: Principal | ICD-10-CM | POA: Diagnosis present

## 2021-01-29 DIAGNOSIS — Z79899 Other long term (current) drug therapy: Secondary | ICD-10-CM | POA: Diagnosis not present

## 2021-01-29 DIAGNOSIS — Z833 Family history of diabetes mellitus: Secondary | ICD-10-CM

## 2021-01-29 DIAGNOSIS — M25562 Pain in left knee: Secondary | ICD-10-CM | POA: Diagnosis present

## 2021-01-29 DIAGNOSIS — Z6841 Body Mass Index (BMI) 40.0 and over, adult: Secondary | ICD-10-CM | POA: Diagnosis not present

## 2021-01-29 DIAGNOSIS — G8918 Other acute postprocedural pain: Secondary | ICD-10-CM

## 2021-01-29 HISTORY — PX: TOTAL KNEE ARTHROPLASTY: SHX125

## 2021-01-29 LAB — CBC
HCT: 35.2 % — ABNORMAL LOW (ref 36.0–46.0)
Hemoglobin: 11.2 g/dL — ABNORMAL LOW (ref 12.0–15.0)
MCH: 30.9 pg (ref 26.0–34.0)
MCHC: 31.8 g/dL (ref 30.0–36.0)
MCV: 97 fL (ref 80.0–100.0)
Platelets: 218 10*3/uL (ref 150–400)
RBC: 3.63 MIL/uL — ABNORMAL LOW (ref 3.87–5.11)
RDW: 13.9 % (ref 11.5–15.5)
WBC: 8.7 10*3/uL (ref 4.0–10.5)
nRBC: 0 % (ref 0.0–0.2)

## 2021-01-29 LAB — CREATININE, SERUM
Creatinine, Ser: 0.98 mg/dL (ref 0.44–1.00)
GFR, Estimated: >60 mL/min (ref >60)

## 2021-01-29 LAB — GLUCOSE, CAPILLARY
Glucose-Capillary: 157 mg/dL — ABNORMAL HIGH (ref 70–99)
Glucose-Capillary: 183 mg/dL — ABNORMAL HIGH (ref 70–99)
Glucose-Capillary: 177 mg/dL — ABNORMAL HIGH (ref 70–99)
Glucose-Capillary: 145 mg/dL — ABNORMAL HIGH (ref 70–99)
Glucose-Capillary: 169 mg/dL — ABNORMAL HIGH (ref 70–99)

## 2021-01-29 SURGERY — ARTHROPLASTY, KNEE, TOTAL
Anesthesia: Spinal | Site: Knee | Laterality: Left

## 2021-01-29 MED ORDER — MORPHINE SULFATE (PF) 10 MG/ML IV SOLN
INTRAVENOUS | Status: DC | PRN
  Administered 2021-01-29: 10 mg

## 2021-01-29 MED ORDER — DIPHENHYDRAMINE HCL 12.5 MG/5ML PO ELIX
12.5000 mg | ORAL_SOLUTION | ORAL | Status: DC | PRN
  Administered 2021-01-31: 12.5 mg via ORAL
  Filled 2021-01-29: qty 5

## 2021-01-29 MED ORDER — METFORMIN HCL 500 MG PO TABS
1000.0000 mg | ORAL_TABLET | Freq: Two times a day (BID) | ORAL | Status: DC
  Administered 2021-01-29 – 2021-01-31 (×3): 1000 mg via ORAL
  Filled 2021-01-29 (×3): qty 2

## 2021-01-29 MED ORDER — TRAMADOL HCL 50 MG PO TABS
50.0000 mg | ORAL_TABLET | Freq: Four times a day (QID) | ORAL | Status: DC
  Administered 2021-01-29 – 2021-01-31 (×8): 50 mg via ORAL
  Filled 2021-01-29 (×10): qty 1

## 2021-01-29 MED ORDER — MIDAZOLAM HCL 5 MG/5ML IJ SOLN
INTRAMUSCULAR | Status: DC | PRN
  Administered 2021-01-29 (×2): 1 mg via INTRAVENOUS

## 2021-01-29 MED ORDER — ALUM & MAG HYDROXIDE-SIMETH 200-200-20 MG/5ML PO SUSP
30.0000 mL | ORAL | Status: DC | PRN
  Administered 2021-01-30: 30 mL via ORAL
  Filled 2021-01-29: qty 30

## 2021-01-29 MED ORDER — 0.9 % SODIUM CHLORIDE (POUR BTL) OPTIME
TOPICAL | Status: DC | PRN
  Administered 2021-01-29: 500 mL

## 2021-01-29 MED ORDER — PHENYLEPHRINE HCL (PRESSORS) 10 MG/ML IV SOLN
INTRAVENOUS | Status: AC
  Filled 2021-01-29: qty 1

## 2021-01-29 MED ORDER — EPHEDRINE SULFATE 50 MG/ML IJ SOLN
INTRAMUSCULAR | Status: DC | PRN
  Administered 2021-01-29: 10 mg via INTRAVENOUS
  Administered 2021-01-29: 5 mg via INTRAVENOUS

## 2021-01-29 MED ORDER — FENTANYL CITRATE (PF) 100 MCG/2ML IJ SOLN: 25.0000 ug | INTRAMUSCULAR | Status: DC | PRN

## 2021-01-29 MED ORDER — PROPOFOL 500 MG/50ML IV EMUL
INTRAVENOUS | Status: DC | PRN
  Administered 2021-01-29: 80 ug/kg/min via INTRAVENOUS

## 2021-01-29 MED ORDER — DOCUSATE SODIUM 100 MG PO CAPS
100.0000 mg | ORAL_CAPSULE | Freq: Two times a day (BID) | ORAL | Status: DC
  Administered 2021-01-29 – 2021-01-31 (×5): 100 mg via ORAL
  Filled 2021-01-29 (×4): qty 1

## 2021-01-29 MED ORDER — CALCIUM CARBONATE ANTACID 500 MG PO CHEW
1.0000 | CHEWABLE_TABLET | ORAL | Status: DC
  Administered 2021-01-29: 200 mg via ORAL
  Filled 2021-01-29: qty 1

## 2021-01-29 MED ORDER — PROPOFOL 1000 MG/100ML IV EMUL
INTRAVENOUS | Status: AC
  Filled 2021-01-29: qty 100

## 2021-01-29 MED ORDER — INSULIN ASPART 100 UNIT/ML IJ SOLN
0.0000 [IU] | Freq: Three times a day (TID) | INTRAMUSCULAR | Status: DC
  Administered 2021-01-29: 3 [IU] via SUBCUTANEOUS
  Administered 2021-01-31: 5 [IU] via SUBCUTANEOUS
  Filled 2021-01-29 (×2): qty 1

## 2021-01-29 MED ORDER — PROPOFOL 10 MG/ML IV BOLUS
INTRAVENOUS | Status: AC
  Filled 2021-01-29: qty 20

## 2021-01-29 MED ORDER — SODIUM CHLORIDE 0.9 % IV SOLN: INTRAVENOUS | Status: DC

## 2021-01-29 MED ORDER — HYDROCODONE-ACETAMINOPHEN 5-325 MG PO TABS
1.0000 | ORAL_TABLET | ORAL | Status: DC | PRN
  Administered 2021-01-29 – 2021-01-31 (×2): 2 via ORAL
  Filled 2021-01-29 (×2): qty 2

## 2021-01-29 MED ORDER — ONDANSETRON HCL 4 MG/2ML IJ SOLN
4.0000 mg | Freq: Four times a day (QID) | INTRAMUSCULAR | Status: DC | PRN
  Administered 2021-01-29 – 2021-01-30 (×2): 4 mg via INTRAVENOUS
  Filled 2021-01-29 (×3): qty 2

## 2021-01-29 MED ORDER — PHENYLEPHRINE HCL (PRESSORS) 10 MG/ML IV SOLN
INTRAVENOUS | Status: DC | PRN
  Administered 2021-01-29: 100 ug via INTRAVENOUS

## 2021-01-29 MED ORDER — LISINOPRIL 20 MG PO TABS
30.0000 mg | ORAL_TABLET | Freq: Every day | ORAL | Status: DC
  Administered 2021-01-29 – 2021-01-31 (×3): 30 mg via ORAL
  Filled 2021-01-29 (×3): qty 1

## 2021-01-29 MED ORDER — ONDANSETRON HCL 4 MG PO TABS
4.0000 mg | ORAL_TABLET | Freq: Four times a day (QID) | ORAL | Status: DC | PRN
  Administered 2021-01-30 – 2021-01-31 (×2): 4 mg via ORAL
  Filled 2021-01-29 (×2): qty 1

## 2021-01-29 MED ORDER — FENTANYL CITRATE (PF) 100 MCG/2ML IJ SOLN
INTRAMUSCULAR | Status: DC | PRN
  Administered 2021-01-29 (×2): 12.5 ug via INTRAVENOUS
  Administered 2021-01-29 (×3): 25 ug via INTRAVENOUS

## 2021-01-29 MED ORDER — HYDROCODONE-ACETAMINOPHEN 7.5-325 MG PO TABS
1.0000 | ORAL_TABLET | ORAL | Status: DC | PRN
  Administered 2021-01-29 – 2021-01-30 (×4): 2 via ORAL
  Administered 2021-01-30: 1 via ORAL
  Filled 2021-01-29: qty 2
  Filled 2021-01-29: qty 1
  Filled 2021-01-29 (×3): qty 2

## 2021-01-29 MED ORDER — MORPHINE SULFATE (PF) 10 MG/ML IV SOLN
INTRAVENOUS | Status: AC
  Filled 2021-01-29: qty 1

## 2021-01-29 MED ORDER — ONDANSETRON HCL 4 MG/2ML IJ SOLN
INTRAMUSCULAR | Status: AC
  Administered 2021-01-29: 4 mg
  Filled 2021-01-29: qty 2

## 2021-01-29 MED ORDER — ONDANSETRON HCL 4 MG/2ML IJ SOLN
INTRAMUSCULAR | Status: AC
  Filled 2021-01-29: qty 2

## 2021-01-29 MED ORDER — BISACODYL 10 MG RE SUPP: 10.0000 mg | Freq: Every day | RECTAL | Status: DC | PRN

## 2021-01-29 MED ORDER — ONDANSETRON HCL 4 MG/2ML IJ SOLN
INTRAMUSCULAR | Status: DC | PRN
  Administered 2021-01-29: 4 mg via INTRAVENOUS

## 2021-01-29 MED ORDER — LISINOPRIL-HYDROCHLOROTHIAZIDE 20-25 MG PO TABS: 1.5000 | ORAL_TABLET | Freq: Every day | ORAL | Status: DC

## 2021-01-29 MED ORDER — LACTATED RINGERS IV BOLUS
500.0000 mL | INTRAVENOUS | Status: AC
  Administered 2021-01-29: 500 mL via INTRAVENOUS

## 2021-01-29 MED ORDER — SODIUM CHLORIDE 0.9 % IV SOLN
INTRAVENOUS | Status: DC | PRN
  Administered 2021-01-29: 60 mL

## 2021-01-29 MED ORDER — ENOXAPARIN SODIUM 30 MG/0.3ML IJ SOSY
30.0000 mg | PREFILLED_SYRINGE | Freq: Two times a day (BID) | INTRAMUSCULAR | Status: DC
  Administered 2021-01-30 – 2021-01-31 (×3): 30 mg via SUBCUTANEOUS
  Filled 2021-01-29 (×3): qty 0.3

## 2021-01-29 MED ORDER — ASPIRIN EC 81 MG PO TBEC
81.0000 mg | DELAYED_RELEASE_TABLET | Freq: Every day | ORAL | Status: DC
  Administered 2021-01-29 – 2021-01-31 (×3): 81 mg via ORAL
  Filled 2021-01-29 (×3): qty 1

## 2021-01-29 MED ORDER — CHLORHEXIDINE GLUCONATE 0.12 % MT SOLN
OROMUCOSAL | Status: AC
  Administered 2021-01-29: 15 mL via OROMUCOSAL
  Filled 2021-01-29: qty 15

## 2021-01-29 MED ORDER — FENTANYL CITRATE (PF) 100 MCG/2ML IJ SOLN
INTRAMUSCULAR | Status: AC
  Filled 2021-01-29: qty 2

## 2021-01-29 MED ORDER — BUPIVACAINE HCL (PF) 0.5 % IJ SOLN
INTRAMUSCULAR | Status: DC | PRN
  Administered 2021-01-29: 2.5 mL

## 2021-01-29 MED ORDER — BUPIVACAINE HCL (PF) 0.5 % IJ SOLN
INTRAMUSCULAR | Status: AC
  Filled 2021-01-29: qty 10

## 2021-01-29 MED ORDER — GLIPIZIDE ER 10 MG PO TB24
10.0000 mg | ORAL_TABLET | Freq: Every day | ORAL | Status: DC
  Filled 2021-01-29 (×2): qty 1

## 2021-01-29 MED ORDER — MORPHINE SULFATE (PF) 2 MG/ML IV SOLN
0.5000 mg | INTRAVENOUS | Status: DC | PRN
  Administered 2021-01-29: 1 mg via INTRAVENOUS
  Filled 2021-01-29: qty 1

## 2021-01-29 MED ORDER — MENTHOL 3 MG MT LOZG
1.0000 | LOZENGE | OROMUCOSAL | Status: DC | PRN
  Filled 2021-01-29: qty 9

## 2021-01-29 MED ORDER — LIDOCAINE HCL (PF) 2 % IJ SOLN
INTRAMUSCULAR | Status: AC
  Filled 2021-01-29: qty 5

## 2021-01-29 MED ORDER — CEFAZOLIN SODIUM-DEXTROSE 2-4 GM/100ML-% IV SOLN
2.0000 g | Freq: Four times a day (QID) | INTRAVENOUS | Status: AC
Start: 2021-01-29 — End: 2021-01-29
  Administered 2021-01-29 (×2): 2 g via INTRAVENOUS
  Filled 2021-01-29 (×3): qty 100

## 2021-01-29 MED ORDER — VITAMIN D3 25 MCG (1000 UNIT) PO TABS
2000.0000 [IU] | ORAL_TABLET | Freq: Every day | ORAL | Status: DC
  Administered 2021-01-30 – 2021-01-31 (×2): 2000 [IU] via ORAL
  Filled 2021-01-29 (×4): qty 2

## 2021-01-29 MED ORDER — MIDAZOLAM HCL 2 MG/2ML IJ SOLN
INTRAMUSCULAR | Status: AC
  Filled 2021-01-29: qty 2

## 2021-01-29 MED ORDER — PROPOFOL 10 MG/ML IV BOLUS
INTRAVENOUS | Status: DC | PRN
  Administered 2021-01-29: 30 mg via INTRAVENOUS

## 2021-01-29 MED ORDER — NEOMYCIN-POLYMYXIN B GU 40-200000 IR SOLN
Status: AC
  Filled 2021-01-29: qty 20

## 2021-01-29 MED ORDER — METHOCARBAMOL 1000 MG/10ML IJ SOLN
500.0000 mg | Freq: Four times a day (QID) | INTRAVENOUS | Status: DC | PRN
  Filled 2021-01-29: qty 5

## 2021-01-29 MED ORDER — SODIUM CHLORIDE 0.9 % IV SOLN
INTRAVENOUS | Status: DC | PRN
  Administered 2021-01-29: 30 ug/min via INTRAVENOUS

## 2021-01-29 MED ORDER — MAGNESIUM CITRATE PO SOLN
1.0000 | Freq: Once | ORAL | Status: DC | PRN
  Filled 2021-01-29: qty 296

## 2021-01-29 MED ORDER — CEFAZOLIN SODIUM-DEXTROSE 2-4 GM/100ML-% IV SOLN
INTRAVENOUS | Status: AC
  Filled 2021-01-29: qty 100

## 2021-01-29 MED ORDER — ROSUVASTATIN CALCIUM 10 MG PO TABS
20.0000 mg | ORAL_TABLET | Freq: Every day | ORAL | Status: DC
  Administered 2021-01-29 – 2021-01-30 (×2): 20 mg via ORAL
  Filled 2021-01-29 (×2): qty 2

## 2021-01-29 MED ORDER — FAMOTIDINE 20 MG PO TABS
ORAL_TABLET | ORAL | Status: AC
  Administered 2021-01-29: 20 mg via ORAL
  Filled 2021-01-29: qty 1

## 2021-01-29 MED ORDER — BUPIVACAINE-EPINEPHRINE (PF) 0.25% -1:200000 IJ SOLN
INTRAMUSCULAR | Status: AC
  Filled 2021-01-29: qty 30

## 2021-01-29 MED ORDER — PIOGLITAZONE HCL 30 MG PO TABS
45.0000 mg | ORAL_TABLET | Freq: Every day | ORAL | Status: DC
  Administered 2021-01-31: 45 mg via ORAL
  Filled 2021-01-29 (×2): qty 1

## 2021-01-29 MED ORDER — PHENOL 1.4 % MT LIQD
1.0000 | OROMUCOSAL | Status: DC | PRN
  Filled 2021-01-29: qty 177

## 2021-01-29 MED ORDER — ONDANSETRON HCL 4 MG/2ML IJ SOLN
4.0000 mg | Freq: Once | INTRAMUSCULAR | Status: AC | PRN
Start: 2021-01-29 — End: 2021-01-29
  Administered 2021-01-29: 4 mg via INTRAVENOUS

## 2021-01-29 MED ORDER — APREPITANT 40 MG PO CAPS
ORAL_CAPSULE | ORAL | Status: AC
  Administered 2021-01-29: 40 mg via ORAL
  Filled 2021-01-29: qty 1

## 2021-01-29 MED ORDER — HYDROCHLOROTHIAZIDE 12.5 MG PO CAPS
37.5000 mg | ORAL_CAPSULE | Freq: Every day | ORAL | Status: DC
  Administered 2021-01-29 – 2021-01-31 (×3): 37.5 mg via ORAL
  Filled 2021-01-29 (×4): qty 3

## 2021-01-29 MED ORDER — ACETAMINOPHEN 325 MG PO TABS: 325.0000 mg | ORAL_TABLET | Freq: Four times a day (QID) | ORAL | Status: DC | PRN

## 2021-01-29 MED ORDER — METOCLOPRAMIDE HCL 5 MG/ML IJ SOLN
5.0000 mg | Freq: Three times a day (TID) | INTRAMUSCULAR | Status: DC | PRN
Start: 2021-01-29 — End: 2021-01-31
  Administered 2021-01-30: 10 mg via INTRAVENOUS
  Filled 2021-01-29: qty 2

## 2021-01-29 MED ORDER — ZOLPIDEM TARTRATE 5 MG PO TABS
5.0000 mg | ORAL_TABLET | Freq: Every evening | ORAL | Status: DC | PRN
  Administered 2021-01-30: 5 mg via ORAL
  Filled 2021-01-29: qty 1

## 2021-01-29 MED ORDER — BUPIVACAINE-EPINEPHRINE (PF) 0.25% -1:200000 IJ SOLN
INTRAMUSCULAR | Status: DC | PRN
  Administered 2021-01-29: 30 mL

## 2021-01-29 MED ORDER — METOCLOPRAMIDE HCL 10 MG PO TABS
5.0000 mg | ORAL_TABLET | Freq: Three times a day (TID) | ORAL | Status: DC | PRN
  Administered 2021-01-31: 10 mg via ORAL
  Filled 2021-01-29: qty 1

## 2021-01-29 MED ORDER — POLYETHYLENE GLYCOL 3350 17 G PO PACK
17.0000 g | PACK | Freq: Every day | ORAL | Status: DC | PRN
  Administered 2021-01-31: 17 g via ORAL
  Filled 2021-01-29: qty 1

## 2021-01-29 MED ORDER — METHOCARBAMOL 500 MG PO TABS
500.0000 mg | ORAL_TABLET | Freq: Four times a day (QID) | ORAL | Status: DC | PRN
  Administered 2021-01-29 – 2021-01-31 (×4): 500 mg via ORAL
  Filled 2021-01-29 (×4): qty 1

## 2021-01-29 MED ORDER — BUPIVACAINE LIPOSOME 1.3 % IJ SUSP
INTRAMUSCULAR | Status: AC
  Filled 2021-01-29: qty 20

## 2021-01-29 MED ORDER — METOPROLOL SUCCINATE ER 25 MG PO TB24
25.0000 mg | ORAL_TABLET | Freq: Every day | ORAL | Status: DC
  Administered 2021-01-30 – 2021-01-31 (×2): 25 mg via ORAL
  Filled 2021-01-29 (×2): qty 1

## 2021-01-29 SURGICAL SUPPLY — 72 items
BLADE SAGITTAL 25.0X1.19X90 (BLADE) ×2 IMPLANT
BLADE SAW 90X13X1.19 OSCILLAT (BLADE) ×2 IMPLANT
BLOCK CUTTING FEMUR 2+ LT MED (MISCELLANEOUS) ×2 IMPLANT
BLOCK CUTTING TIBIAL 3 LT (MISCELLANEOUS) ×2 IMPLANT
BNDG ELASTIC 6X5.8 VLCR STR LF (GAUZE/BANDAGES/DRESSINGS) ×2 IMPLANT
CANISTER SUCT 1200ML W/VALVE (MISCELLANEOUS) ×2 IMPLANT
CANISTER WOUND CARE 500ML ATS (WOUND CARE) ×2 IMPLANT
CEMENT HV SMART SET (Cement) ×4 IMPLANT
CHLORAPREP W/TINT 26 (MISCELLANEOUS) ×4 IMPLANT
COOLER POLAR GLACIER W/PUMP (MISCELLANEOUS) ×2 IMPLANT
CUFF TOURN SGL QUICK 24 (TOURNIQUET CUFF)
CUFF TOURN SGL QUICK 34 (TOURNIQUET CUFF)
CUFF TRNQT CYL 24X4X16.5-23 (TOURNIQUET CUFF) IMPLANT
CUFF TRNQT CYL 34X4.125X (TOURNIQUET CUFF) IMPLANT
DRAPE 3/4 80X56 (DRAPES) ×4 IMPLANT
DRSG MEPILEX SACRM 8.7X9.8 (GAUZE/BANDAGES/DRESSINGS) ×2 IMPLANT
ELECT CAUTERY BLADE 6.4 (BLADE) ×2 IMPLANT
ELECT REM PT RETURN 9FT ADLT (ELECTROSURGICAL) ×2
ELECTRODE REM PT RTRN 9FT ADLT (ELECTROSURGICAL) ×1 IMPLANT
FEMERAL COMPONENT LEFT SZ2P (Femur) ×2 IMPLANT
FEMUR BONE MODEL 4.9010 MEDACT (MISCELLANEOUS) ×2 IMPLANT
GAUZE 4X4 16PLY ~~LOC~~+RFID DBL (SPONGE) ×2 IMPLANT
GAUZE SPONGE 4X4 12PLY STRL (GAUZE/BANDAGES/DRESSINGS) ×2 IMPLANT
GAUZE XEROFORM 1X8 LF (GAUZE/BANDAGES/DRESSINGS) ×2 IMPLANT
GLOVE SURG ORTHO LTX SZ8 (GLOVE) ×2 IMPLANT
GLOVE SURG SYN 9.0  PF PI (GLOVE) ×1
GLOVE SURG SYN 9.0 PF PI (GLOVE) ×1 IMPLANT
GLOVE SURG UNDER LTX SZ8 (GLOVE) ×2 IMPLANT
GLOVE SURG UNDER POLY LF SZ9 (GLOVE) ×2 IMPLANT
GOWN SRG 2XL LVL 4 RGLN SLV (GOWNS) ×1 IMPLANT
GOWN STRL NON-REIN 2XL LVL4 (GOWNS) ×1
GOWN STRL REUS W/ TWL LRG LVL3 (GOWN DISPOSABLE) ×1 IMPLANT
GOWN STRL REUS W/ TWL XL LVL3 (GOWN DISPOSABLE) ×1 IMPLANT
GOWN STRL REUS W/TWL LRG LVL3 (GOWN DISPOSABLE) ×1
GOWN STRL REUS W/TWL XL LVL3 (GOWN DISPOSABLE) ×1
HOLDER FOLEY CATH W/STRAP (MISCELLANEOUS) ×2 IMPLANT
HOOD PEEL AWAY FLYTE STAYCOOL (MISCELLANEOUS) ×4 IMPLANT
INSERT TIBIAL SZ3 LT 13 FLEX (Insert) ×2 IMPLANT
IRRIGATION SURGIPHOR STRL (IV SOLUTION) IMPLANT
IV NS IRRIG 3000ML ARTHROMATIC (IV SOLUTION) ×2 IMPLANT
KIT PREVENA INCISION MGT20CM45 (CANNISTER) ×2 IMPLANT
KIT TURNOVER KIT A (KITS) ×2 IMPLANT
MANIFOLD NEPTUNE II (INSTRUMENTS) ×2 IMPLANT
NDL SAFETY ECLIPSE 18X1.5 (NEEDLE) ×1 IMPLANT
NEEDLE HYPO 18GX1.5 SHARP (NEEDLE) ×1
NEEDLE SPNL 18GX3.5 QUINCKE PK (NEEDLE) ×2 IMPLANT
NEEDLE SPNL 20GX3.5 QUINCKE YW (NEEDLE) ×2 IMPLANT
NS IRRIG 1000ML POUR BTL (IV SOLUTION) ×2 IMPLANT
PACK TOTAL KNEE (MISCELLANEOUS) ×2 IMPLANT
PAD WRAPON POLAR KNEE (MISCELLANEOUS) ×1 IMPLANT
PATELLA RESURFACING MEDACTA 02 (Bone Implant) ×2 IMPLANT
PENCIL SMOKE EVACUATOR COATED (MISCELLANEOUS) ×2 IMPLANT
PULSAVAC PLUS IRRIG FAN TIP (DISPOSABLE) ×2
SCALPEL PROTECTED #10 DISP (BLADE) ×4 IMPLANT
SPONGE T-LAP 18X18 ~~LOC~~+RFID (SPONGE) ×6 IMPLANT
STAPLER SKIN PROX 35W (STAPLE) ×2 IMPLANT
STEM EXTENSION 11MMX30MM (Stem) ×2 IMPLANT
SUCTION FRAZIER HANDLE 10FR (MISCELLANEOUS) ×1
SUCTION TUBE FRAZIER 10FR DISP (MISCELLANEOUS) ×1 IMPLANT
SUT DVC 2 QUILL PDO  T11 36X36 (SUTURE) ×1
SUT DVC 2 QUILL PDO T11 36X36 (SUTURE) ×1 IMPLANT
SUT ETHIBOND 2 V 37 (SUTURE) IMPLANT
SUT V-LOC 90 ABS DVC 3-0 CL (SUTURE) ×2 IMPLANT
SYR 20ML LL LF (SYRINGE) ×2 IMPLANT
SYR 50ML LL SCALE MARK (SYRINGE) ×4 IMPLANT
TIBIAL BONE MODEL LEFT (MISCELLANEOUS) ×2 IMPLANT
TIBIAL TRAY FIXED MEDACTA 0207 (Bone Implant) ×2 IMPLANT
TIP FAN IRRIG PULSAVAC PLUS (DISPOSABLE) ×1 IMPLANT
TOWEL OR 17X26 4PK STRL BLUE (TOWEL DISPOSABLE) ×2 IMPLANT
TOWER CARTRIDGE SMART MIX (DISPOSABLE) ×2 IMPLANT
TRAY FOLEY MTR SLVR 16FR STAT (SET/KITS/TRAYS/PACK) ×2 IMPLANT
WRAPON POLAR PAD KNEE (MISCELLANEOUS) ×2

## 2021-01-29 NOTE — Evaluation (Signed)
Occupational Therapy Evaluation Patient Details Name: Emily Jacobs MRN: [id number] DOB: [birth year] Today's Date: 01/29/2021    History of Present Illness Pt is a 70 y.o. F s/p L TKA on 01/29/21. PMH includes R TKA (2018), DM, Diverticulitis, HLD, HTN, obesity.   Clinical Impression   Pt seen for OT evaluation this date, POD#0 from above surgery. Pt was independent in all ADLs prior to surgery and is eager to return to PLOF with less pain and improved safety and independence. Pt currently requires minimal assist for LB dressing and bathing while in seated position due to pain and limited AROM of L knee. Pt endorses 8/10 L knee pain. CGA for ADL transfers. Pt/spouse instructed in polar care mgt, falls prevention strategies, home/routines modifications, DME/AE for LB bathing and dressing tasks, and compression stocking mgt. Pt would benefit from skilled OT services while hospitalized including additional instruction in dressing techniques with or without assistive devices for dressing and bathing skills to support recall and carryover prior to discharge and ultimately to maximize safety, independence, and minimize falls risk and caregiver burden. Do not currently anticipate any OT needs following this hospitalization.      Follow Up Recommendations  No OT follow up    Equipment Recommendations  None recommended by OT    Recommendations for Other Services       Precautions / Restrictions Precautions Precautions: Knee;Fall Precaution Booklet Issued: Yes (comment) Restrictions Weight Bearing Restrictions: Yes LLE Weight Bearing: Weight bearing as tolerated      Mobility Bed Mobility Overal bed mobility: Needs Assistance Bed Mobility: Supine to Sit     Supine to sit: Min guard;HOB elevated     General bed mobility comments: NT, up in recliner    Transfers Overall transfer level: Needs assistance Equipment used: Rolling walker (2 wheeled) Transfers: Sit to/from Stand Sit to  Stand: Min guard         General transfer comment: able to stand from lowered surface height    Balance Overall balance assessment: Needs assistance Sitting-balance support: Single extremity supported;Feet supported Sitting balance-Leahy Scale: Good Sitting balance - Comments: Able to reach down, up, across body   Standing balance support: During functional activity;Bilateral upper extremity supported Standing balance-Leahy Scale: Fair                             ADL either performed or assessed with clinical judgement   ADL Overall ADL's : Needs assistance/impaired                                       General ADL Comments: Pt requires MIN A for LB ADL for LLE 2/2 decreased strength/ROM and pain in L knee; spouse able to provide needed level of assist     Vision Patient Visual Report: No change from baseline       Perception     Praxis      Pertinent Vitals/Pain Pain Assessment: 0-10 Pain Score: 8  Pain Location: L knee Pain Descriptors / Indicators: Aching;Sore Pain Intervention(s): Limited activity within patient's tolerance;Monitored during session;Premedicated before session;Repositioned;Ice applied     Hand Dominance Right   Extremity/Trunk Assessment Upper Extremity Assessment Upper Extremity Assessment: Overall WFL for tasks assessed   Lower Extremity Assessment Lower Extremity Assessment: LLE deficits/detail RLE Deficits / Details: Able to move against gravity RLE Sensation: WNL LLE Deficits / Details:  expected post-op strength/ROM deficits LLE Sensation: WNL (SILT dorsal, plantar surface of foot & LE)   Cervical / Trunk Assessment Cervical / Trunk Assessment: Normal   Communication Communication Communication: No difficulties   Cognition Arousal/Alertness: Awake/alert Behavior During Therapy: WFL for tasks assessed/performed Overall Cognitive Status: Within Functional Limits for tasks assessed                                  General Comments: AOx4   General Comments       Exercises Total Joint Exercises Ankle Circles/Pumps: AROM;10 reps;Both;Supine Short Arc Quad: AROM;Left;10 reps;Supine Heel Slides: AROM;10 reps;Supine;Left Straight Leg Raises: AROM;Left;Supine (3 reps, minimal quad lag) Goniometric ROM: 74 degree knee flexion (seated) Other Exercises Other Exercises: Pt education: WB, precautions, HEP Other Exercises: Pt and spouse instructed in polar care mgt, falls prevention, compression stocking mgt, AE/DME, and home/routines modifications, handout provided to support recall and carryover   Shoulder Instructions      Home Living Family/patient expects to be discharged to:: Private residence Living Arrangements: Spouse/significant other;Children Available Help at Discharge: Family Type of Home: House Home Access: Stairs to enter Secretary/administrator of Steps: 7 Entrance Stairs-Rails: Right;Left Home Layout: One level     Bathroom Shower/Tub: Producer, television/film/video: Standard Bathroom Accessibility: Yes   Home Equipment: Environmental consultant - 2 wheels;Cane - single point;Grab bars - tub/shower;Adaptive equipment Adaptive Equipment: Reacher        Prior Functioning/Environment Level of Independence: Independent with assistive device(s)        Comments: Pt does not utilize AD for mobility. Retired, watches grandchildren during the day.        OT Problem List: Decreased strength;Pain;Decreased range of motion      OT Treatment/Interventions: Self-care/ADL training;Therapeutic exercise;Therapeutic activities;DME and/or AE instruction;Patient/family education    OT Goals(Current goals can be found in the care plan section) Acute Rehab OT Goals Patient Stated Goal: to go home OT Goal Formulation: With patient/family Time For Goal Achievement: 02/12/21 Potential to Achieve Goals: Good ADL Goals Pt Will Perform Lower Body Dressing: with modified  independence;with adaptive equipment;sit to/from stand Pt Will Transfer to Toilet: with modified independence;ambulating (BSC over toilet, LRAD for amb) Additional ADL Goal #1: Pt will independently instruct family in polar care mgt Additional ADL Goal #2: Pt will independently instruct family in compression stocking mgt  OT Frequency: Min 1X/week   Barriers to D/C:            Co-evaluation              AM-PAC OT "6 Clicks" Daily Activity     Outcome Measure Help from another person eating meals?: None Help from another person taking care of personal grooming?: None Help from another person toileting, which includes using toliet, bedpan, or urinal?: A Little Help from another person bathing (including washing, rinsing, drying)?: A Little Help from another person to put on and taking off regular upper body clothing?: None Help from another person to put on and taking off regular lower body clothing?: A Little 6 Click Score: 21   End of Session    Activity Tolerance: Patient tolerated treatment well Patient left: in chair;with call bell/phone within reach;with chair alarm set;with family/visitor present;with SCD's reapplied;Other (comment) (polar care in place)  OT Visit Diagnosis: Other abnormalities of gait and mobility (R26.89);Pain Pain - Right/Left: Left Pain - part of body: Knee  Time: 6606-3016 OT Time Calculation (min): 20 min Charges:  OT General Charges $OT Visit: 1 Visit OT Evaluation $OT Eval Low Complexity: 1 Low OT Treatments $Self Care/Home Management : 8-22 mins  Wynona Canes, MPH, MS, OTR/L ascom [phone] 01/29/21, 3:39 PM

## 2021-01-29 NOTE — Transfer of Care (Cosign Needed)
Immediate Anesthesia Transfer of Care Note  Patient: Emily Jacobs  Procedure(s) Performed: TOTAL KNEE ARTHROPLASTY - RNFA needed (Left: Knee)  Patient Location: PACU  Anesthesia Type:Spinal  Level of Consciousness: awake and oriented  Airway & Oxygen Therapy: Patient Spontanous Breathing and Patient connected to face mask oxygen  Post-op Assessment: Report given to RN and Post -op Vital signs reviewed and stable  Post vital signs: Reviewed and stable  Last Vitals:  Vitals Value Taken Time  BP 110/55 01/29/21 0925  Temp    Pulse 72 01/29/21 0926  Resp 21 01/29/21 0926  SpO2 100 % 01/29/21 0926  Vitals shown include unvalidated device data.  Last Pain:  Vitals:   01/29/21 0701  TempSrc: Oral  PainSc: 3          Complications: No notable events documented.

## 2021-01-29 NOTE — Anesthesia Preprocedure Evaluation (Addendum)
Anesthesia Evaluation  Patient identified by MRN, date of birth, ID band Patient awake    Reviewed: Allergy & Precautions, NPO status , Patient's Chart, lab work & pertinent test results  History of Anesthesia Complications (+) PONV and history of anesthetic complications  Airway Mallampati: III  TM Distance: >3 FB Neck ROM: Full    Dental  (+) Implants   Pulmonary neg pulmonary ROS, neg sleep apnea, neg COPD,    breath sounds clear to auscultation- rhonchi (-) wheezing      Cardiovascular Exercise Tolerance: Good hypertension, Pt. on medications (-) CAD, (-) Past MI, (-) Cardiac Stents and (-) CABG  Rhythm:Regular Rate:Normal - Systolic murmurs and - Diastolic murmurs    Neuro/Psych neg Seizures negative neurological ROS  negative psych ROS   GI/Hepatic negative GI ROS, Neg liver ROS,   Endo/Other  diabetes, Oral Hypoglycemic Agents  Renal/GU negative Renal ROS     Musculoskeletal  (+) Arthritis ,   Abdominal (+) + obese,   Peds  Hematology  (+) anemia ,   Anesthesia Other Findings Past Medical History: No date: Anemia No date: Arthritis     Comment:  Osteoarthritis No date: Diabetes mellitus without complication (HCC) No date: Diverticulosis No date: History of kidney stones No date: Hypercholesteremia No date: Hypertension No date: PONV (postoperative nausea and vomiting)   Reproductive/Obstetrics                             Lab Results  Component Value Date   WBC 5.9 01/18/2021   HGB 12.2 01/18/2021   HCT 37.2 01/18/2021   MCV 94.2 01/18/2021   PLT 253 01/18/2021    Anesthesia Physical Anesthesia Plan  ASA: 2  Anesthesia Plan: Spinal   Post-op Pain Management:    Induction:   PONV Risk Score and Plan: 3 and Propofol infusion  Airway Management Planned: Natural Airway  Additional Equipment:   Intra-op Plan:   Post-operative Plan:   Informed Consent: I  have reviewed the patients History and Physical, chart, labs and discussed the procedure including the risks, benefits and alternatives for the proposed anesthesia with the patient or authorized representative who has indicated his/her understanding and acceptance.     Dental advisory given  Plan Discussed with: CRNA and Anesthesiologist  Anesthesia Plan Comments:         Anesthesia Quick Evaluation

## 2021-01-29 NOTE — H&P (Signed)
Lasandra Beech [place], [place] - 01/22/2021 11:00 AM EDT Formatting of this note is different from the original. [facility] Chief Complaint:   Chief Complaint  Patient presents with   Knee Pain  H & P LEFT KNEE   History of Present Illness:   Emily Jacobs is a 70 y.o. female that presents to clinic today for her preoperative history and evaluation. Patient presents with her husband. The patient is scheduled to undergo a left total knee arthroplasty on 01/29/2021 by Dr. Rosita Kea. Her pain began several years ago. The pain is located primarily along the medial aspect of the knee. She describes her pain as worse with ambulation.   The patient's symptoms have progressed to the point that they decrease her quality of life. The patient has previously undergone conservative treatment including NSAIDS and injections to the knee without adequate control of her symptoms.  Denies history of blood clots, lumbar surgery, or significant cardiac history. No significant drug allergy. Patient is a diabetic, her last A1C was 6.4 on 10/30/20. Patient does have CKD, GFR is 57.   Past Medical, Surgical, Family, Social History, Allergies, Medications:   Past Medical History:  Past Medical History:  Diagnosis Date   Allergic state  UPPER RESPIRATORY   DIABETES MELLITUS   Diverticulitis   History of nephrolithiasis   Hx of adenomatous colonic polyps 01/03/2019   Hyperlipidemia   Hypertension   Morbid obesity (CMS-HCC)   Past Surgical History:  Past Surgical History:  Procedure Laterality Date   ARTHROPLASTY TOTAL KNEE Right 04/07/2017  Dr.Ezrah Dembeck   COLONOSCOPY 01/06/2001  Adenomatous Polyp   COLONOSCOPY 06/17/2004  Adenomatous Polyp; [facility] (Brother)   COLONOSCOPY 09/18/2008  PH Adenomatous Polyp; [facility] (Brother)   COLONOSCOPY 09/21/2013  Adenomatous Polyp; [facility] (Brother) CBF 09/2018: Recall ltr mailed   COLONOSCOPY 02/03/2019  Tubular adenoma of the  colon/Repeat 57yrs/TKT   HYSTERECTOMY   oopherectomy Left   Current Medications:  Current Outpatient Medications  Medication Sig Dispense Refill   acetaminophen (TYLENOL) 500 MG tablet Take 1,000 mg by mouth every 8 (eight) hours as needed for Pain   aspirin 81 mg tablet 1 tab(s) By Mouth Daily   cholecalciferol (VITAMIN D3) 2,000 unit capsule Take 2,000 Units by mouth once daily.    glipiZIDE (GLUCOTROL XL) 10 MG XL tablet Take 1 tablet (10 mg total) by mouth once daily 90 tablet 3   lisinopriL-hydrochlorothiazide (ZESTORETIC) 20-25 mg tablet TAKE 1 AND 1/2 TABLETS BY MOUTH DAILY 135 tablet 1   metFORMIN (GLUCOPHAGE) 1000 MG tablet Take 1 tablet (1,000 mg total) by mouth 2 (two) times daily 180 tablet 3   metoprolol succinate (TOPROL-XL) 25 MG XL tablet Take 1 tablet (25 mg total) by mouth once daily 90 tablet 3   neomycin-bacitracnZn-polymyxnB 3.5-500-10,000 mg-unit-unit Oint Apply topically once daily as needed   rosuvastatin (CRESTOR) 20 MG tablet Take 1 tablet (20 mg total) by mouth once daily 90 tablet 3   omega-3 acid ethyl esters (LOVAZA) 1 gram capsule Take by mouth   omega-3 fatty acids/fish oil 340-1,000 mg capsule Take 1 capsule by mouth once daily   pioglitazone (ACTOS) 45 MG tablet Take 1 tablet (45 mg total) by mouth once daily 90 tablet 2   No current facility-administered medications for this visit.   Allergies:  Allergies  Allergen Reactions   Codeine Abdominal Pain   Other Other (See Comments) and Swelling  Corn, tea, and eggs: nasal congestion   Amlodipine Headache and Other (  See Comments)  Headache   Egg Other (See Comments)  Other Reaction: NASAL/SINUS CONGESTION   Others Other (See Comments)  Uncoded Allergy. Allergen: CORN, TEA, Other Reaction: NASAL/SINUS CONGESTION   Social History:  Social History   Socioeconomic History   Marital status: Married  Spouse name: Riley Lam   Number of children: 4   Years of education: 9   Highest education level: 9th  grade  Tobacco Use   Smoking status: Never Smoker   Smokeless tobacco: Never Used  Vaping Use   Vaping Use: Never used  Substance and Sexual Activity   Alcohol use: Not Currently  Alcohol/week: 0.0 standard drinks  Comment: very rarely   Drug use: No   Sexual activity: Not Currently  Partners: Male  Birth control/protection: Post-menopausal  Social History Narrative  Married. Denies use tobacco. Only occasionally consumes alcoholic beverage. About one cup of coffee a day. Regular physical exercise. Always uses the seat belt. denies illigal or recreational drugs. She is a housewife.   Family History:  Family History  Problem Relation Age of Onset   High blood pressure (Hypertension) Mother   Diabetes type II Mother   Stroke Mother   Dementia Mother   High blood pressure (Hypertension) Father   Myocardial Infarction (Heart attack) Father   High blood pressure (Hypertension) Brother   Diabetes type II Brother   Cancer Brother  COLON   Review of Systems:   A 10+ ROS was performed, reviewed, and the pertinent orthopaedic findings are documented in the HPI.   Physical Examination:   BP (!) 162/80 (BP Location: Left upper arm, Patient Position: Sitting, BP Cuff Size: Large Adult)  Ht 152.4 cm (5')  Wt (!) 119.4 kg (263 lb 3.2 oz)  LMP (LMP Unknown)  BMI 51.40 kg/m   Patient is a well-developed, well-nourished female in no acute distress. Patient has normal mood and affect. Patient is alert and oriented to person, place, and time.   HEENT: Atraumatic, normocephalic. Pupils equal and reactive to light. Extraocular motion intact. Noninjected sclera.  Cardiovascular: Regular rate and rhythm, with no murmurs, rubs, or gallops. Distal pulses auscultated with Doppler.  Respiratory: Lungs clear to auscultation bilaterally.   Range of motion examination of the left knee shows 120 degrees of flexion with near full extension. Tender to palpation along the medial joint line. Medial  pseudolaxity noted. Patient able to plantarflex and dorsiflex the left knee. Patient able to flex and extend the toes of the left foot.  Sensation intact to light touch over the saphenous, lateral sural cutaneous, superficial fibular, and deep fibular nerve distributions.  Tests Performed/Reviewed:  X-rays  No new x-rays were obtained. Previous x-rays showed complete loss of medial compartment joint space with bone-on-bone contact and significant osteophyte formation.  Impression:   ICD-10-CM  1. Primary localized osteoarthritis of left knee M17.12   Plan:   The patient has end-stage degenerative changes of the left knee. It was explained to the patient that the condition is progressive in nature. Having failed conservative treatment, the patient has elected to proceed with a total joint arthroplasty. The patient will undergo a total joint arthroplasty with Dr. Rosita Kea. The risks of surgery, including blood clot and infection, were discussed with the patient. Measures to reduce these risks, including the use of anticoagulation, perioperative antibiotics, and early ambulation were discussed. The importance of postoperative physical therapy was discussed with the patient. The patient elects to proceed with surgery. The patient is instructed to stop all blood thinners prior to  surgery. The patient is instructed to call the hospital the day before surgery to learn of the proper arrival time.   Contact our office with any questions or concerns. Follow up as indicated, or sooner should any new problems arise, if conditions worsen, or if they are otherwise concerned.   Michelene Gardener, PA -C [facility] [address] [place], [place] [address] Phone: [phone]  This note was generated in part with voice recognition software and I apologize for any typographical errors that were not detected and corrected.  Electronically signed by Michelene Gardener, PA at 01/22/2021 12:55 PM EDT  Reviewed  H+P. No changes noted.'

## 2021-01-29 NOTE — Evaluation (Signed)
Physical Therapy Evaluation Patient Details Name: Emily Jacobs MRN: [id number] DOB: [birth year] Today's Date: 01/29/2021   History of Present Illness  Pt is a 70 y.o. F s/p L TKA on 01/29/21. PMH includes R TKA (2018), DM, Diverticulitis, HLD, HTN, obesity.  Clinical Impression  Pt in bed w/ family present at bedside upon PT entry. Pt AOX4, independent without AD for mobility, watches grandchildren during daytime, and will have support at home from family 24x7.   Pt requires min-guard for bed mobility while utilizing bed rails, HOB elevated. Min-guard for transfers with RW from lowered bed height demonstrating good control and stability. Ambulated in room with RW, min-guard for safety, cues for technique. Pt noted nausea with upright sitting and standing, nursing notified. Ambulation distance to increase during next PT session. Skilled PT intervention is indicated to address deficits in function, mobility, and to return to PLOF as able.  Discharge recommendations are HHPT to return to PLOF and ability for family support.     Follow Up Recommendations Home health PT    Equipment Recommendations  None recommended by PT    Recommendations for Other Services       Precautions / Restrictions Precautions Precautions: Knee;Fall Precaution Booklet Issued: Yes (comment) Restrictions Weight Bearing Restrictions: Yes LLE Weight Bearing: Weight bearing as tolerated      Mobility  Bed Mobility Overal bed mobility: Needs Assistance Bed Mobility: Supine to Sit     Supine to sit: Min guard;HOB elevated     General bed mobility comments: Utilizes bed rails    Transfers Overall transfer level: Needs assistance Equipment used: Rolling walker (2 wheeled) Transfers: Sit to/from Stand Sit to Stand: Min guard         General transfer comment: able to stand from lowered surface height  Ambulation/Gait   Gait Distance (Feet): 5 Feet Assistive device: Rolling walker (2 wheeled) Gait  Pattern/deviations: Step-to pattern;Decreased stride length;Decreased step length - left Gait velocity: Decreased      Stairs            Wheelchair Mobility    Modified Rankin (Stroke Patients Only)       Balance Overall balance assessment: Needs assistance Sitting-balance support: Single extremity supported;Feet supported Sitting balance-Leahy Scale: Good Sitting balance - Comments: Able to reach down, up, across body   Standing balance support: During functional activity;Bilateral upper extremity supported Standing balance-Leahy Scale: Fair                               Pertinent Vitals/Pain Pain Assessment: 0-10 Pain Score: 4  Pain Location: L knee Pain Descriptors / Indicators: Aching;Sore Pain Intervention(s): Limited activity within patient's tolerance;Monitored during session;Ice applied    Home Living Family/patient expects to be discharged to:: Private residence Living Arrangements: Spouse/significant other;Children Available Help at Discharge: Family Type of Home: House Home Access: Stairs to enter Entrance Stairs-Rails: Doctor, general practice of Steps: 7 Home Layout: One level Home Equipment: Environmental consultant - 2 wheels;Cane - single point      Prior Function Level of Independence: Independent with assistive device(s)         Comments: Pt does not utilize AD for mobility. Retired, watches grandchildren during the day.     Hand Dominance        Extremity/Trunk Assessment   Upper Extremity Assessment Upper Extremity Assessment: Overall WFL for tasks assessed    Lower Extremity Assessment Lower Extremity Assessment: RLE deficits/detail;LLE deficits/detail RLE Deficits / Details:  Able to move against gravity RLE Sensation: WNL LLE Deficits / Details: Full knee extension, slight quad lag with SLR, KF: 70 degree LLE Sensation: WNL (SILT dorsal, plantar surface of foot & LE)    Cervical / Trunk Assessment Cervical / Trunk  Assessment: Normal  Communication   Communication: No difficulties  Cognition Arousal/Alertness: Awake/alert Behavior During Therapy: WFL for tasks assessed/performed Overall Cognitive Status: Within Functional Limits for tasks assessed                                 General Comments: AOx4      General Comments      Exercises Total Joint Exercises Ankle Circles/Pumps: AROM;10 reps;Both;Supine Short Arc Quad: AROM;Left;10 reps;Supine Heel Slides: AROM;10 reps;Supine;Left Straight Leg Raises: AROM;Left;Supine (3 reps, minimal quad lag) Goniometric ROM: 74 degree knee flexion (seated) Other Exercises Other Exercises: Pt education: WB, precautions, HEP   Assessment/Plan    PT Assessment Patient needs continued PT services  PT Problem List Decreased strength;Decreased range of motion;Decreased activity tolerance;Decreased balance;Decreased mobility       PT Treatment Interventions Gait training;Stair training;Functional mobility training;Therapeutic activities;Therapeutic exercise;Neuromuscular re-education;Balance training    PT Goals (Current goals can be found in the Care Plan section)  Acute Rehab PT Goals Patient Stated Goal: to go home PT Goal Formulation: With patient/family Time For Goal Achievement: 02/12/21 Potential to Achieve Goals: Good    Frequency BID   Barriers to discharge        Co-evaluation               AM-PAC PT "6 Clicks" Mobility  Outcome Measure Help needed turning from your back to your side while in a flat bed without using bedrails?: A Little Help needed moving from lying on your back to sitting on the side of a flat bed without using bedrails?: A Little Help needed moving to and from a bed to a chair (including a wheelchair)?: A Little Help needed standing up from a chair using your arms (e.g., wheelchair or bedside chair)?: A Little Help needed to walk in hospital room?: A Lot Help needed climbing 3-5 steps with a  railing? : A Lot 6 Click Score: 16    End of Session Equipment Utilized During Treatment: Gait belt Activity Tolerance: Patient tolerated treatment well Patient left: in chair;with call bell/phone within reach;with chair alarm set;with family/visitor present;with SCD's reapplied Nurse Communication: Mobility status PT Visit Diagnosis: Muscle weakness (generalized) (M62.81);Other abnormalities of gait and mobility (R26.89)    Time: 6222-9798 PT Time Calculation (min) (ACUTE ONLY): 770 min   Charges:            [employer], SPT

## 2021-01-29 NOTE — Progress Notes (Signed)
Dynamap wouldn't validate/Recognize patient, vital signs entered manually

## 2021-01-29 NOTE — Op Note (Signed)
01/29/2021  9:20 AM  PATIENT:  Emily Jacobs   MRN: [id number]  PRE-OPERATIVE DIAGNOSIS:  Primary localized osteoarthritis of left knee   POST-OPERATIVE DIAGNOSIS:  Same   PROCEDURE:  Procedure(s): Left TOTAL KNEE ARTHROPLASTY   SURGEON: Leitha Schuller, MD   ASSISTANTS: Burna Forts RNFA   ANESTHESIA:   spinal   EBL: 100   BLOOD ADMINISTERED:none   DRAINS:  Incisional wound VAC     LOCAL MEDICATIONS USED:  MARCAINE    and OTHER morphine and Exparel   SPECIMEN:  No Specimen   DISPOSITION OF SPECIMEN:  N/A   COUNTS:  YES   TOURNIQUET: 53 minutes at 300 mm Hg   IMPLANTS: Medacta  GMK sphere system with 2+ left femur, 3 tibia with short stem and 13 mm insert.  Size 2 patella, all components cemented.   DICTATION: Reubin Milan Dictation   patient was brought to the operating room and spinal anesthesia was obtained.  After prepping and draping the left leg in sterile fashion, and after patient identification and timeout procedures were completed, tourniquet was raised  and midline skin incision was made followed by medial parapatellar arthrotomy with severe medial compartment osteoarthritis, severe patellofemoral arthritis and moderate  lateral compartment arthritis, partial synovectomy was also carried out.   The ACL and PCL and fat pad were excised along with anterior horns of the meniscus. The proximal tibia cutting guide from  the [facility] system was applied and the proximal tibia cut carried out.  The distal femoral cut was carried out in a similar fashion     The 2+ femoral cutting guide applied with anterior posterior and chamfer cuts made.  The posterior horns of the menisci were removed at this point.   Injection of the above medication was carried out after the femoral and tibial cuts were carried out.  The 3 baseplate trial was placed pinned into position and proximal tibial preparation carried out with drilling hand reaming and the keel punch followed by placement of the 2+ femur  and sizing the tibial insert size 13 millimeter gave the best fit with stability and full extension.  The distal femoral drill holes were made in the notch cut for the trochlear groove was then carried out with trials were then removed the patella was cut using the patellar cutting guide and it sized to a size 2 after drill holes have been made  The knee was irrigated with pulsatile lavage and the bony surfaces dried the tibial component was cemented into place first.  Excess cement was removed and the polyethylene insert placed with a torque screw placed with a torque screwdriver tightened.  The distal femoral component was placed and the knee was held in extension as the patellar button was clamped into place.  After the cement was set, excess cement was removed and the knee was again irrigated thoroughly thoroughly irrigated.  The tourniquet was let down and hemostasis checked with electrocautery. The arthrotomy was repaired with a heavy Quill suture,  followed by 3-0 V lock subcuticular closure, skin staples followed by incisional wound VAC and Polar Care.Marland Kitchen   PLAN OF CARE: Admit to inpatient    PATIENT DISPOSITION:  PACU - hemodynamically stable.

## 2021-01-30 LAB — BASIC METABOLIC PANEL
Anion gap: 8 (ref 5–15)
BUN: 21 mg/dL (ref 8–23)
CO2: 24 mmol/L (ref 22–32)
Calcium: 9 mg/dL (ref 8.9–10.3)
Chloride: 101 mmol/L (ref 98–111)
Creatinine, Ser: 0.96 mg/dL (ref 0.44–1.00)
GFR, Estimated: >60 mL/min (ref >60)
Glucose, Bld: 173 mg/dL — ABNORMAL HIGH (ref 70–99)
Potassium: 4.7 mmol/L (ref 3.5–5.1)
Sodium: 133 mmol/L — ABNORMAL LOW (ref 135–145)

## 2021-01-30 LAB — CBC
HCT: 33 % — ABNORMAL LOW (ref 36.0–46.0)
Hemoglobin: 10.8 g/dL — ABNORMAL LOW (ref 12.0–15.0)
MCH: 32.1 pg (ref 26.0–34.0)
MCHC: 32.7 g/dL (ref 30.0–36.0)
MCV: 98.2 fL (ref 80.0–100.0)
Platelets: 197 10*3/uL (ref 150–400)
RBC: 3.36 MIL/uL — ABNORMAL LOW (ref 3.87–5.11)
RDW: 14 % (ref 11.5–15.5)
WBC: 6.9 10*3/uL (ref 4.0–10.5)
nRBC: 0 % (ref 0.0–0.2)

## 2021-01-30 LAB — GLUCOSE, CAPILLARY
Glucose-Capillary: 180 mg/dL — ABNORMAL HIGH (ref 70–99)
Glucose-Capillary: 190 mg/dL — ABNORMAL HIGH (ref 70–99)
Glucose-Capillary: 185 mg/dL — ABNORMAL HIGH (ref 70–99)
Glucose-Capillary: 165 mg/dL — ABNORMAL HIGH (ref 70–99)

## 2021-01-30 MED ORDER — CHLORHEXIDINE GLUCONATE CLOTH 2 % EX PADS
6.0000 | MEDICATED_PAD | Freq: Every day | CUTANEOUS | Status: DC
  Administered 2021-01-30 – 2021-01-31 (×2): 6 via TOPICAL

## 2021-01-30 NOTE — Progress Notes (Signed)
Physical Therapy Treatment Patient Details Name: Emily Jacobs MRN: [id number] DOB: 05/26/1951 Today's Date: 01/30/2021    History of Present Illness Pt is a 70 y.o. F s/p L TKA on 01/29/21. PMH includes R TKA (2018), DM, Diverticulitis, HLD, HTN, obesity.    PT Comments    Pt at bedside indicating 8/10 pain w/ inability to move LLE. Premedicated prior to mobilization, nursing provided additional meds post-session. Performed there ex with PT assist to warm up LE. Pt transferred to [facility] with min-guard assist, RW. Pt continues to demonstrate good control concentrically and eccentrically with transfers. Progressed ambulation to 100 ft w/ min-guard, RW, distance limited due to increased symptoms of malaise, fatigue, and pain. Pt demonstrated moderate gait speed with step-through gait pattern, but requires cues to slow down with in room ambulation for safety. Skilled PT intervention is indicated to address deficits in function, mobility, and to return to PLOF as able.  Discharge recommendations remain HHPT.    Follow Up Recommendations  Home health PT     Equipment Recommendations  None recommended by PT    Recommendations for Other Services       Precautions / Restrictions Precautions Precautions: Knee;Fall Precaution Booklet Issued: Yes (comment) Required Braces or Orthoses: Knee Immobilizer - Left Restrictions Weight Bearing Restrictions: Yes LLE Weight Bearing: Weight bearing as tolerated    Mobility  Bed Mobility Overal bed mobility: Needs Assistance Bed Mobility: Supine to Sit     Supine to sit: Min guard;HOB elevated     General bed mobility comments: utilizes bed rails    Transfers Overall transfer level: Needs assistance Equipment used: Rolling walker (2 wheeled) Transfers: Sit to/from Stand Sit to Stand: Min guard         General transfer comment: able to stand from lowered surface height  Ambulation/Gait Ambulation/Gait assistance: Min guard Gait Distance  (Feet): 100 Feet Assistive device: Rolling walker (2 wheeled) Gait Pattern/deviations: Decreased stride length;Decreased step length - left;Step-through pattern Gait velocity: Moderate       Stairs             Wheelchair Mobility    Modified Rankin (Stroke Patients Only)       Balance Overall balance assessment: Needs assistance Sitting-balance support: Single extremity supported;Feet supported Sitting balance-Leahy Scale: Good     Standing balance support: During functional activity;Bilateral upper extremity supported Standing balance-Leahy Scale: Fair Standing balance comment: requires BUE support                            Cognition Arousal/Alertness: Awake/alert Behavior During Therapy: WFL for tasks assessed/performed Overall Cognitive Status: Within Functional Limits for tasks assessed                                        Exercises Total Joint Exercises Short Arc Quad: AROM;Left;10 reps;Supine Heel Slides: AROM;10 reps;Supine;Left Other Exercises Other Exercises: Bed > BSC, assisted w/ pericare    General Comments        Pertinent Vitals/Pain Pain Assessment: 0-10 Pain Score: 8  Pain Location: L knee Pain Descriptors / Indicators: Aching;Sore Pain Intervention(s): Limited activity within patient's tolerance;Monitored during session;Repositioned;Premedicated before session;Patient requesting pain meds-RN notified;Ice applied    Home Living                      Prior Function  PT Goals (current goals can now be found in the care plan section) Acute Rehab PT Goals Patient Stated Goal: to go home PT Goal Formulation: With patient/family Time For Goal Achievement: 02/12/21 Potential to Achieve Goals: Good Progress towards PT goals: Progressing toward goals    Frequency    BID      PT Plan Current plan remains appropriate    Co-evaluation              AM-PAC PT "6 Clicks"  Mobility   Outcome Measure  Help needed turning from your back to your side while in a flat bed without using bedrails?: A Little Help needed moving from lying on your back to sitting on the side of a flat bed without using bedrails?: A Little Help needed moving to and from a bed to a chair (including a wheelchair)?: A Little Help needed standing up from a chair using your arms (e.g., wheelchair or bedside chair)?: A Little Help needed to walk in hospital room?: A Little Help needed climbing 3-5 steps with a railing? : A Lot 6 Click Score: 17    End of Session Equipment Utilized During Treatment: Gait belt Activity Tolerance: Patient tolerated treatment well;Patient limited by pain Patient left: in chair;with call bell/phone within reach;with chair alarm set;with family/visitor present;with SCD's reapplied;with nursing/sitter in room Nurse Communication: Mobility status PT Visit Diagnosis: Muscle weakness (generalized) (M62.81);Other abnormalities of gait and mobility (R26.89)     Time: 5277-8242 PT Time Calculation (min) (ACUTE ONLY): 55 min  Charges:                        [employer], SPT

## 2021-01-30 NOTE — Anesthesia Postprocedure Evaluation (Signed)
Anesthesia Post Note  Patient: Emily Jacobs  Procedure(s) Performed: TOTAL KNEE ARTHROPLASTY - RNFA needed (Left: Knee)  Patient location during evaluation: Nursing Unit Anesthesia Type: Spinal Level of consciousness: oriented and awake and alert Pain management: pain level controlled Vital Signs Assessment: post-procedure vital signs reviewed and stable Respiratory status: spontaneous breathing and respiratory function stable Cardiovascular status: blood pressure returned to baseline and stable Postop Assessment: no headache, no backache, no apparent nausea or vomiting and patient able to bend at knees Anesthetic complications: no   No notable events documented.   Last Vitals:  Vitals:   01/30/21 0616 01/30/21 0813  BP: 128/72 (!) 126/48  Pulse: 72 64  Resp:  16  Temp:  37.1 C  SpO2:  92%    Last Pain:  Vitals:   01/30/21 0616  TempSrc:   PainSc: 8                  Starling Manns

## 2021-01-30 NOTE — Progress Notes (Signed)
Met with the patient and her family at the bedside to discuss DC pan and needs She lives at home with her husband, has a rolling walker, and has gotten a 3 in 1 recently but unsure where it is, her daughter will get her another one if needed She is set up with [facility] for [facility] services  She has transportation and can afford her medications

## 2021-01-30 NOTE — Progress Notes (Signed)
Foley was removed at 0700. Pt made aware to void within 6 hours. Will notify incoming shift. Will continue to monitor.

## 2021-01-30 NOTE — Progress Notes (Signed)
Occupational Therapy Treatment Patient Details Name: Emily Jacobs MRN: [id number] DOB: March 09, 1951 Today's Date: 01/30/2021    History of present illness Pt is a 70 y.o. F s/p L TKA on 01/29/21. PMH includes R TKA (2018), DM, Diverticulitis, HLD, HTN, obesity.   OT comments  Pt seen for OT tx this date to f/u re: safety with ADLs/ADL mobility. Pt requires CGA to MIN A with bed mobility. Requires MOD A for LB dressing and OT completes re-education re: use of grabber for LB ADLs. In addition, OT completes re-education with pt and spouse re: compression stocking and polar care mgt. Both parties with good understanding. Pt continues to progress from OT services. Because she is still struggling to complete LB ADLs, updated pt's d/c recommendation to [facility] to ensure that she reaches highest level of self care INDEP to improve safety and reduce caregiver burden. Will continue to follow acutely.    Follow Up Recommendations  Home health OT    Equipment Recommendations  None recommended by OT    Recommendations for Other Services      Precautions / Restrictions Precautions Precautions: Knee;Fall Restrictions Weight Bearing Restrictions: Yes LLE Weight Bearing: Weight bearing as tolerated       Mobility Bed Mobility Overal bed mobility: Needs Assistance Bed Mobility: Supine to Sit;Sit to Supine     Supine to sit: Min guard;HOB elevated Sit to supine: Min assist        Transfers Overall transfer level: Needs assistance Equipment used: Rolling walker (2 wheeled) Transfers: Sit to/from Stand Sit to Stand: Min guard         General transfer comment: deferred    Balance Overall balance assessment: Needs assistance Sitting-balance support: Single extremity supported;Feet supported Sitting balance-Leahy Scale: Good Sitting balance - Comments: G static sitting, limited tolerance for dynamic reaching in sitting, mostly d/t LE pain   Standing balance support: During functional  activity;Bilateral upper extremity supported Standing balance-Leahy Scale: Fair Standing balance comment: defer                           ADL either performed or assessed with clinical judgement   ADL Overall ADL's : Needs assistance/impaired                     Lower Body Dressing: Moderate assistance;Sitting/lateral leans Lower Body Dressing Details (indicate cue type and reason): with cues and demo of how to implement grabber/reacher into LB dressing including donning/doffing pants/underwear.                     Vision Patient Visual Report: No change from baseline     Perception     Praxis      Cognition Arousal/Alertness: Awake/alert Behavior During Therapy: WFL for tasks assessed/performed Overall Cognitive Status: Within Functional Limits for tasks assessed                                 General Comments: AOx4        Exercises Other Exercises Other Exercises: OT ed re: polar care mgt, compression stocking mgt, positioning while resting.   Shoulder Instructions       General Comments      Pertinent Vitals/ Pain       Pain Assessment: 0-10 Pain Score: 8  Pain Location: L knee Pain Descriptors / Indicators: Aching;Sore Pain Intervention(s): Limited activity within patient's tolerance;Monitored  during session;Repositioned;Ice applied  Home Living                                          Prior Functioning/Environment              Frequency  Min 1X/week        Progress Toward Goals  OT Goals(current goals can now be found in the care plan section)  Progress towards OT goals: Progressing toward goals  Acute Rehab OT Goals Patient Stated Goal: to go home OT Goal Formulation: With patient/family Time For Goal Achievement: 02/12/21 Potential to Achieve Goals: Good  Plan Discharge plan needs to be updated    Co-evaluation                 AM-PAC OT "6 Clicks" Daily Activity      Outcome Measure   Help from another person eating meals?: None Help from another person taking care of personal grooming?: None Help from another person toileting, which includes using toliet, bedpan, or urinal?: A Little Help from another person bathing (including washing, rinsing, drying)?: A Little Help from another person to put on and taking off regular upper body clothing?: None Help from another person to put on and taking off regular lower body clothing?: A Little 6 Click Score: 21    End of Session Equipment Utilized During Treatment: Gait belt;Rolling walker  OT Visit Diagnosis: Other abnormalities of gait and mobility (R26.89);Pain Pain - Right/Left: Left Pain - part of body: Knee   Activity Tolerance Patient tolerated treatment well   Patient Left in chair;with call bell/phone within reach;with chair alarm set;with family/visitor present;with SCD's reapplied;Other (comment)   Nurse Communication          Time: 0973-5329 OT Time Calculation (min): 23 min  Charges: OT General Charges $OT Visit: 1 Visit OT Treatments $Self Care/Home Management : 23-37 mins  Rejeana Brock, MS, OTR/L ascom [phone] 01/30/21, 5:08 PM

## 2021-01-30 NOTE — Plan of Care (Signed)
  Problem: Activity: Goal: Ability to avoid complications of mobility impairment will improve Outcome: Progressing   Problem: Health Behavior/Discharge Planning: Goal: Ability to manage health-related needs will improve Outcome: Progressing   Problem: Pain Managment: Goal: General experience of comfort will improve Outcome: Progressing   Problem: Safety: Goal: Ability to remain free from injury will improve Outcome: Progressing

## 2021-01-30 NOTE — Progress Notes (Signed)
  Subjective: 1 Day Post-Op Procedure(s) (LRB): TOTAL KNEE ARTHROPLASTY - RNFA needed (Left) Patient reports pain as moderate.   Patient is well, and has had no acute complaints or problems Plan is to go Home after hospital stay. Negative for chest pain and shortness of breath Fever: no Gastrointestinal: positive for nausea, no vomiting.  Patient has not had a bowel movement.  Objective: Vital signs in last 24 hours: Temp:  [97.3 F (36.3 C)-99 F (37.2 C)] 98.7 F (37.1 C) (07/20 0506) Pulse Rate:  [64-80] 72 (07/20 0616) Resp:  [15-22] 16 (07/20 0506) BP: (75-141)/(34-72) 128/72 (07/20 0616) SpO2:  [95 %-100 %] 95 % (07/20 0506)  Intake/Output from previous day:  Intake/Output Summary (Last 24 hours) at 01/30/2021 0740 Last data filed at 01/30/2021 0700 Gross per 24 hour  Intake 2850.81 ml  Output 2975 ml  Net -124.19 ml    Intake/Output this shift: No intake/output data recorded.  Labs: Recent Labs    01/29/21 1106 01/30/21 0454  HGB 11.2* 10.8*   Recent Labs    01/29/21 1106 01/30/21 0454  WBC 8.7 6.9  RBC 3.63* 3.36*  HCT 35.2* 33.0*  PLT 218 197   Recent Labs    01/29/21 1106 01/30/21 0454  NA  --  133*  K  --  4.7  CL  --  101  CO2  --  24  BUN  --  21  CREATININE 0.98 0.96  GLUCOSE  --  173*  CALCIUM  --  9.0   No results for input(s): LABPT, INR in the last 72 hours.   EXAM General - Patient is Alert, Appropriate, and Oriented Extremity - Neurovascular intact Dorsiflexion/Plantar flexion intact Compartment soft Dressing/Incision -Prevena in place, no drainage Motor Function - intact, moving foot and toes well on exam.   Able to perform SLR with assistance  Cardiovascular- Regular rate and rhythm, no murmurs/rubs/gallops Respiratory-  minimal crackles heard in lung bases Gastrointestinal- soft, nontender, and active bowel sounds   Assessment/Plan: 1 Day Post-Op Procedure(s) (LRB): TOTAL KNEE ARTHROPLASTY - RNFA needed (Left) Active  Problems:   S/P TKR (total knee replacement) using cement, left  Estimated body mass index is 48.83 kg/m as calculated from the following:   Height as of this encounter: 5\' 2"  (1.575 m).   Weight as of this encounter: 121.1 kg. Advance diet Up with therapy  No incentive spirometer in room, spoke with nursing     DVT Prophylaxis - Lovenox, Ted hose, and foot pumps Weight-Bearing as tolerated to left leg  , PA-C [facility] 01/30/2021, 7:40 AM

## 2021-01-30 NOTE — Plan of Care (Signed)
  Problem: Activity: Goal: Ability to avoid complications of mobility impairment will improve Outcome: Progressing Goal: Range of joint motion will improve Outcome: Progressing   Problem: Clinical Measurements: Goal: Postoperative complications will be avoided or minimized Outcome: Progressing   Problem: Skin Integrity: Goal: Will show signs of wound healing Outcome: Progressing   Problem: Education: Goal: Knowledge of General Education information will improve Description: Including pain rating scale, medication(s)/side effects and non-pharmacologic comfort measures Outcome: Progressing   Problem: Clinical Measurements: Goal: Ability to maintain clinical measurements within normal limits will improve Outcome: Progressing Goal: Will remain free from infection Outcome: Progressing Goal: Diagnostic test results will improve Outcome: Progressing Goal: Cardiovascular complication will be avoided Outcome: Progressing   Problem: Activity: Goal: Risk for activity intolerance will decrease Outcome: Progressing   Problem: Nutrition: Goal: Adequate nutrition will be maintained Outcome: Progressing   Problem: Coping: Goal: Level of anxiety will decrease Outcome: Progressing   Problem: Elimination: Goal: Will not experience complications related to bowel motility Outcome: Progressing Goal: Will not experience complications related to urinary retention Outcome: Progressing   Problem: Pain Managment: Goal: General experience of comfort will improve Outcome: Progressing   Problem: Safety: Goal: Ability to remain free from injury will improve Outcome: Progressing

## 2021-01-30 NOTE — Progress Notes (Addendum)
Physical Therapy Treatment Patient Details Name: Emily Jacobs MRN: [id number] DOB: [birth year] Today's Date: 01/30/2021    History of Present Illness Pt is a 70 y.o. F s/p L TKA on 01/29/21. PMH includes R TKA (2018), DM, Diverticulitis, HLD, HTN, obesity.    PT Comments    Pt alert, motivated for PT. Assisted patient to [facility] over standard commode and with pericare. Pt able to transfer from lowered surface heights with RW, min-guard for safety. Progressed ambulation distance to 200 ft with RW, min-guard for safety. Two rest breaks were required due to decreased endurance. Moderate gait velocity with step-through pattern when walking, cue to encourage hip and knee flexion during swing to improve gait pattern. PT to assess stairs in AM. Skilled PT intervention is indicated to address deficits in function, mobility, and to return to PLOF as able.  Discharge recommendations remain HHPT.   Follow Up Recommendations  Home health PT     Equipment Recommendations  None recommended by PT    Recommendations for Other Services       Precautions / Restrictions Precautions Precautions: Knee;Fall Restrictions Weight Bearing Restrictions: Yes LLE Weight Bearing: Weight bearing as tolerated    Mobility  Bed Mobility Overal bed mobility: Needs Assistance Bed Mobility: Supine to Sit     Supine to sit: Min guard;HOB elevated          Transfers Overall transfer level: Needs assistance Equipment used: Rolling walker (2 wheeled) Transfers: Sit to/from Stand Sit to Stand: Min guard            Ambulation/Gait Ambulation/Gait assistance: Min guard Gait Distance (Feet): 200 Feet Assistive device: Rolling walker (2 wheeled) Gait Pattern/deviations: Step-through pattern;Decreased stride length Gait velocity: Moderate   General Gait Details: hip hike during R swing   Stairs             Wheelchair Mobility    Modified Rankin (Stroke Patients Only)       Balance Overall  balance assessment: Needs assistance Sitting-balance support: Single extremity supported;Feet supported Sitting balance-Leahy Scale: Good     Standing balance support: During functional activity;Bilateral upper extremity supported Standing balance-Leahy Scale: Fair Standing balance comment: requires BUE support                            Cognition Arousal/Alertness: Awake/alert Behavior During Therapy: WFL for tasks assessed/performed Overall Cognitive Status: Within Functional Limits for tasks assessed                                        Exercises Other Exercises Other Exercises: Bed > BSC, assisted w/ pericare    General Comments        Pertinent Vitals/Pain Pain Assessment: 0-10 Pain Score: 8  Pain Location: L knee Pain Descriptors / Indicators: Aching;Sore Pain Intervention(s): Limited activity within patient's tolerance;Monitored during session;Repositioned;Ice applied;Patient requesting pain meds-RN notified    Home Living                      Prior Function            PT Goals (current goals can now be found in the care plan section) Acute Rehab PT Goals Patient Stated Goal: to go home PT Goal Formulation: With patient/family Time For Goal Achievement: 02/12/21 Potential to Achieve Goals: Good Progress towards PT goals: Progressing toward goals  Frequency    BID      PT Plan Current plan remains appropriate    Co-evaluation              AM-PAC PT "6 Clicks" Mobility   Outcome Measure  Help needed turning from your back to your side while in a flat bed without using bedrails?: A Little Help needed moving from lying on your back to sitting on the side of a flat bed without using bedrails?: A Little Help needed moving to and from a bed to a chair (including a wheelchair)?: A Little Help needed standing up from a chair using your arms (e.g., wheelchair or bedside chair)?: A Little Help needed to walk in  hospital room?: A Little Help needed climbing 3-5 steps with a railing? : A Lot 6 Click Score: 17    End of Session Equipment Utilized During Treatment: Gait belt Activity Tolerance: Patient tolerated treatment well Patient left: with call bell/phone within reach;with family/visitor present;with SCD's reapplied;with nursing/sitter in room;in bed;with bed alarm set Nurse Communication: Mobility status PT Visit Diagnosis: Muscle weakness (generalized) (M62.81);Other abnormalities of gait and mobility (R26.89)     Time: 7858-8502 PT Time Calculation (min) (ACUTE ONLY): 54 min  Charges:                        [employer], SPT

## 2021-01-31 ENCOUNTER — Encounter: Payer: Self-pay | Admitting: Orthopedic Surgery

## 2021-01-31 LAB — GLUCOSE, CAPILLARY
Glucose-Capillary: 204 mg/dL — ABNORMAL HIGH (ref 70–99)
Glucose-Capillary: 272 mg/dL — ABNORMAL HIGH (ref 70–99)

## 2021-01-31 MED ORDER — TRAMADOL HCL 50 MG PO TABS: 50.0000 mg | ORAL_TABLET | Freq: Four times a day (QID) | ORAL | 0 refills | Status: AC | PRN

## 2021-01-31 MED ORDER — ENOXAPARIN SODIUM 40 MG/0.4ML IJ SOSY: 40.0000 mg | PREFILLED_SYRINGE | INTRAMUSCULAR | 0 refills | Status: AC

## 2021-01-31 MED ORDER — HYDROCODONE-ACETAMINOPHEN 5-325 MG PO TABS: 1.0000 | ORAL_TABLET | ORAL | 0 refills | Status: AC | PRN

## 2021-01-31 MED ORDER — METHOCARBAMOL 500 MG PO TABS: 500.0000 mg | ORAL_TABLET | Freq: Four times a day (QID) | ORAL | 0 refills | Status: AC | PRN

## 2021-01-31 MED ORDER — DOCUSATE SODIUM 100 MG PO CAPS: 100.0000 mg | ORAL_CAPSULE | Freq: Two times a day (BID) | ORAL | 0 refills | Status: AC

## 2021-01-31 MED ORDER — SODIUM CHLORIDE 0.9 % IR SOLN
Status: DC | PRN
  Administered 2021-01-29: 3000 mL

## 2021-01-31 NOTE — Plan of Care (Signed)
Pt's pain well-managed this shift. No acute events.  Problem: Activity: Goal: Ability to avoid complications of mobility impairment will improve Outcome: Progressing Goal: Range of joint motion will improve Outcome: Progressing   Problem: Clinical Measurements: Goal: Postoperative complications will be avoided or minimized Outcome: Progressing   Problem: Skin Integrity: Goal: Will show signs of wound healing Outcome: Progressing   Problem: Education: Goal: Knowledge of General Education information will improve Description: Including pain rating scale, medication(s)/side effects and non-pharmacologic comfort measures Outcome: Progressing   Problem: Health Behavior/Discharge Planning: Goal: Ability to manage health-related needs will improve Outcome: Progressing   Problem: Clinical Measurements: Goal: Ability to maintain clinical measurements within normal limits will improve Outcome: Progressing Goal: Will remain free from infection Outcome: Progressing Goal: Diagnostic test results will improve Outcome: Progressing Goal: Cardiovascular complication will be avoided Outcome: Progressing   Problem: Activity: Goal: Risk for activity intolerance will decrease Outcome: Progressing   Problem: Nutrition: Goal: Adequate nutrition will be maintained Outcome: Progressing   Problem: Coping: Goal: Level of anxiety will decrease Outcome: Progressing   Problem: Elimination: Goal: Will not experience complications related to bowel motility Outcome: Progressing Goal: Will not experience complications related to urinary retention Outcome: Progressing   Problem: Pain Managment: Goal: General experience of comfort will improve Outcome: Progressing   Problem: Safety: Goal: Ability to remain free from injury will improve Outcome: Progressing   Problem: Skin Integrity: Goal: Risk for impaired skin integrity will decrease Outcome: Progressing   Problem: Education: Goal:  Ability to describe self-care measures that may prevent or decrease complications (Diabetes Survival Skills Education) will improve Outcome: Progressing   Problem: Coping: Goal: Ability to adjust to condition or change in health will improve Outcome: Progressing   Problem: Fluid Volume: Goal: Ability to maintain a balanced intake and output will improve Outcome: Progressing   Problem: Health Behavior/Discharge Planning: Goal: Ability to identify and utilize available resources and services will improve Outcome: Progressing Goal: Ability to manage health-related needs will improve Outcome: Progressing   Problem: Metabolic: Goal: Ability to maintain appropriate glucose levels will improve Outcome: Progressing   Problem: Nutritional: Goal: Maintenance of adequate nutrition will improve Outcome: Progressing   Problem: Skin Integrity: Goal: Risk for impaired skin integrity will decrease Outcome: Progressing   Problem: Tissue Perfusion: Goal: Adequacy of tissue perfusion will improve Outcome: Progressing

## 2021-01-31 NOTE — Progress Notes (Signed)
Pt transitioned to prevena - dc PPW and RX given.

## 2021-01-31 NOTE — Progress Notes (Signed)
   Subjective: 2 Days Post-Op Procedure(s) (LRB): TOTAL KNEE ARTHROPLASTY - RNFA needed (Left) Patient reports pain as mild.   Patient is well, and has had no acute complaints or problems Denies any CP, SOB, ABD pain. We will continue therapy today.  Plan is to go Home after hospital stay.  Objective: Vital signs in last 24 hours: Temp:  [97.7 F (36.5 C)-98.6 F (37 C)] 98.6 F (37 C) (07/21 0801) Pulse Rate:  [81-99] 92 (07/21 0801) Resp:  [16-18] 17 (07/21 0801) BP: (129-140)/(52-70) 139/70 (07/21 0801) SpO2:  [93 %-97 %] 94 % (07/21 0801)  Intake/Output from previous day: 07/20 0701 - 07/21 0700 In: 320 [P.O.:320] Out: 0  Intake/Output this shift: No intake/output data recorded.  Recent Labs    01/29/21 1106 01/30/21 0454  HGB 11.2* 10.8*   Recent Labs    01/29/21 1106 01/30/21 0454  WBC 8.7 6.9  RBC 3.63* 3.36*  HCT 35.2* 33.0*  PLT 218 197   Recent Labs    01/29/21 1106 01/30/21 0454  NA  --  133*  K  --  4.7  CL  --  101  CO2  --  24  BUN  --  21  CREATININE 0.98 0.96  GLUCOSE  --  173*  CALCIUM  --  9.0   No results for input(s): LABPT, INR in the last 72 hours.  EXAM General - Patient is Alert, Appropriate, and Oriented Extremity - Neurovascular intact Sensation intact distally Intact pulses distally Dorsiflexion/Plantar flexion intact Incision: dressing C/D/I and no drainage No cellulitis present Compartment soft Dressing - dressing C/D/I and no drainage Praveena intact without drainage Motor Function - intact, moving foot and toes well on exam.   Past Medical History:  Diagnosis Date   Anemia    Arthritis    Osteoarthritis   Diabetes mellitus without complication (HCC)    Diverticulosis    History of kidney stones    Hypercholesteremia    Hypertension    PONV (postoperative nausea and vomiting)     Assessment/Plan:   2 Days Post-Op Procedure(s) (LRB): TOTAL KNEE ARTHROPLASTY - RNFA needed (Left) Active Problems:   S/P  TKR (total knee replacement) using cement, left  Estimated body mass index is 48.83 kg/m as calculated from the following:   Height as of this encounter: 5\' 2"  (1.575 m).   Weight as of this encounter: 121.1 kg. Advance diet Up with therapy Work on bowel movement Vital signs are stable Pain well controlled Good progress of physical therapy, patient completed all PT goals.  Plan on discharge to home with home health PT today  DVT Prophylaxis - Lovenox, TED hose, and SCDs Weight-Bearing as tolerated to left leg   T. , PA-C [facility] 01/31/2021, 10:21 AM

## 2021-01-31 NOTE — Discharge Summary (Signed)
Physician Discharge Summary  Patient ID: Emily Jacobs MRN: [id number] DOB/AGE: 03-15-1951 70 y.o.  Admit date: 01/29/2021 Discharge date: 01/31/2021  Admission Diagnoses:  S/P TKR (total knee replacement) using cement, left [Z96.652]   Discharge Diagnoses: Patient Active Problem List   Diagnosis Date Noted   S/P TKR (total knee replacement) using cement, left 01/29/2021   Primary localized osteoarthritis of right knee 04/07/2017    Past Medical History:  Diagnosis Date   Anemia    Arthritis    Osteoarthritis   Diabetes mellitus without complication (HCC)    Diverticulosis    History of kidney stones    Hypercholesteremia    Hypertension    PONV (postoperative nausea and vomiting)      Transfusion: none   Consultants (if any):   Discharged Condition: Improved  Hospital Course: Emily Jacobs is an 70 y.o. female who was admitted 01/29/2021 with a diagnosis of left knee osteoarthritis and went to the operating room on 01/29/2021 and underwent the above named procedures.    Surgeries: Procedure(s): TOTAL KNEE ARTHROPLASTY - RNFA needed on 01/29/2021 Patient tolerated the surgery well. Taken to PACU where she was stabilized and then transferred to the orthopedic floor.  Started on Lovenox 30 mg q 12 hrs. Foot pumps applied bilaterally at 80 mm. Heels elevated on bed with rolled towels. No evidence of DVT. Negative Homan. Physical therapy started on day #1 for gait training and transfer. OT started day #1 for ADL and assisted devices.  Patient's foley was d/c on day #1. Patient's IV  was d/c on day #2.  On post op day #2 patient was stable and ready for discharge to home with home health PT.    She was given perioperative antibiotics:  Anti-infectives (From admission, onward)    Start     Dose/Rate Route Frequency Ordered Stop   01/29/21 1400  ceFAZolin (ANCEF) IVPB 2g/100 mL premix        2 g 200 mL/hr over 30 Minutes Intravenous Every 6 hours 01/29/21 1036 01/29/21  2338   01/29/21 0613  ceFAZolin (ANCEF) 2-4 GM/100ML-% IVPB       Note to Pharmacy: Mikey Bussing   : cabinet override      01/29/21 0613 01/29/21 0751   01/29/21 0600  ceFAZolin (ANCEF) IVPB 2g/100 mL premix        2 g 200 mL/hr over 30 Minutes Intravenous On call to O.R. 01/28/21 2242 01/29/21 0820     .  She was given sequential compression devices, early ambulation, and Lovenox, teds for DVT prophylaxis.  She benefited maximally from the hospital stay and there were no complications.    Recent vital signs:  Vitals:   01/31/21 0626 01/31/21 0801  BP: 130/61 139/70  Pulse: 99 92  Resp: 16 17  Temp: 98.1 F (36.7 C) 98.6 F (37 C)  SpO2: 97% 94%    Recent laboratory studies:  Lab Results  Component Value Date   HGB 10.8 (L) 01/30/2021   HGB 11.2 (L) 01/29/2021   HGB 12.2 01/18/2021   Lab Results  Component Value Date   WBC 6.9 01/30/2021   PLT 197 01/30/2021   Lab Results  Component Value Date   INR 0.97 03/25/2017   Lab Results  Component Value Date   NA 133 (L) 01/30/2021   K 4.7 01/30/2021   CL 101 01/30/2021   CO2 24 01/30/2021   BUN 21 01/30/2021   CREATININE 0.96 01/30/2021   GLUCOSE 173 (H) 01/30/2021  Discharge Medications:   Allergies as of 01/31/2021       Reactions   Other Swelling, Other (See Comments)   Corn, tea, and eggs: nasal congestion   Amlodipine Other (See Comments)   Headache   Codeine Nausea And Vomiting        Medication List     STOP taking these medications    acetaminophen 500 MG tablet Commonly known as: TYLENOL   aspirin EC 81 MG tablet       TAKE these medications    docusate sodium 100 MG capsule Commonly known as: COLACE Take 1 capsule (100 mg total) by mouth 2 (two) times daily.   enoxaparin 40 MG/0.4ML injection Commonly known as: LOVENOX Inject 0.4 mLs (40 mg total) into the skin daily for 14 days.   glipiZIDE 10 MG 24 hr tablet Commonly known as: GLUCOTROL XL Take 10 mg by mouth daily  before breakfast.   HYDROcodone-acetaminophen 5-325 MG tablet Commonly known as: NORCO/VICODIN Take 1-2 tablets by mouth every 4 (four) hours as needed for moderate pain (pain score 4-6).   lisinopril-hydrochlorothiazide 20-25 MG tablet Commonly known as: ZESTORETIC Take 1.5 tablets by mouth daily.   metFORMIN 1000 MG tablet Commonly known as: GLUCOPHAGE Take 1,000 mg by mouth 2 (two) times daily.   methocarbamol 500 MG tablet Commonly known as: ROBAXIN Take 1 tablet (500 mg total) by mouth every 6 (six) hours as needed for muscle spasms.   metoprolol succinate 25 MG 24 hr tablet Commonly known as: TOPROL-XL Take 25 mg by mouth daily.   neomycin-bacitracin-polymyxin ointment Commonly known as: NEOSPORIN Apply 1 application topically as needed for wound care.   OVER THE COUNTER MEDICATION Take 1 tablet by mouth daily. diabetes doctor blood sugar 24 hour otc supplement   pioglitazone 45 MG tablet Commonly known as: ACTOS Take 45 mg by mouth daily.   rosuvastatin 20 MG tablet Commonly known as: CRESTOR Take 20 mg by mouth at bedtime.   traMADol 50 MG tablet Commonly known as: ULTRAM Take 1 tablet (50 mg total) by mouth every 6 (six) hours as needed.   Vitamin D 50 MCG (2000 UT) Caps Take 2,000 Units by mouth daily.               Durable Medical Equipment  (From admission, onward)           Start     Ordered   01/29/21 1037  DME Walker rolling  Once       Question Answer Comment  Walker: With 5 Inch Wheels   Patient needs a walker to treat with the following condition S/P TKR (total knee replacement) using cement, left      01/29/21 1036   01/29/21 1037  DME 3 n 1  Once        01/29/21 1036   01/29/21 1037  DME Bedside commode  Once       Question:  Patient needs a bedside commode to treat with the following condition  Answer:  S/P TKR (total knee replacement) using cement, left   01/29/21 1036            Diagnostic Studies: DG Knee 1-2 Views  Left  Result Date: 01/29/2021 CLINICAL DATA:  Postop LEFT knee EXAM: LEFT KNEE - 1-2 VIEW COMPARISON:  Portable exam 0934 hours compared to CT LEFT knee 12/13/2020 FINDINGS: Osseous demineralization. LEFT knee prosthesis identified, stem of tibial component canted laterally abutting the lateral tibial cortex. No acute fracture, dislocation, or bone destruction.  Anterior skin clips and wound VAC present. IMPRESSION: LEFT knee prosthesis as above. Electronically Signed   By: Ulyses Southward M.D.   On: 01/29/2021 10:14    Disposition: Discharge disposition: 06-Home-Health Care Svc          Follow-up Information     Evon Slack, PA-C Follow up in 2 week(s).   Specialties: Orthopedic Surgery, Emergency Medicine Contact information: [address] [place] [address] [phone]                  Signed: Patience Musca 01/31/2021, 10:27 AM

## 2021-01-31 NOTE — Discharge Instructions (Signed)

## 2021-01-31 NOTE — Plan of Care (Signed)
  Problem: Activity: Goal: Ability to avoid complications of mobility impairment will improve 01/31/2021 1322 by Jean Rosenthal, RN Outcome: Adequate for Discharge 01/31/2021 [phone] by Jean Rosenthal, RN Outcome: Progressing Goal: Range of joint motion will improve 01/31/2021 1322 by Jean Rosenthal, RN Outcome: Adequate for Discharge 01/31/2021 0836 by Jean Rosenthal, RN Outcome: Progressing   Problem: Clinical Measurements: Goal: Postoperative complications will be avoided or minimized 01/31/2021 1322 by Jean Rosenthal, RN Outcome: Adequate for Discharge 01/31/2021 0836 by Jean Rosenthal, RN Outcome: Progressing   Problem: Skin Integrity: Goal: Will show signs of wound healing 01/31/2021 1322 by Jean Rosenthal, RN Outcome: Adequate for Discharge 01/31/2021 0836 by Jean Rosenthal, RN Outcome: Progressing   Problem: Health Behavior/Discharge Planning: Goal: Ability to manage health-related needs will improve 01/31/2021 1322 by Jean Rosenthal, RN Outcome: Adequate for Discharge 01/31/2021 0836 by Jean Rosenthal, RN Outcome: Progressing   Problem: Clinical Measurements: Goal: Ability to maintain clinical measurements within normal limits will improve 01/31/2021 1322 by Jean Rosenthal, RN Outcome: Adequate for Discharge 01/31/2021 0836 by Jean Rosenthal, RN Outcome: Progressing Goal: Will remain free from infection 01/31/2021 1322 by Jean Rosenthal, RN Outcome: Adequate for Discharge 01/31/2021 0836 by Jean Rosenthal, RN Outcome: Progressing Goal: Diagnostic test results will improve 01/31/2021 1322 by Jean Rosenthal, RN Outcome: Adequate for Discharge 01/31/2021 0836 by Jean Rosenthal, RN Outcome: Progressing Goal: Cardiovascular complication will be avoided 01/31/2021 1322 by Jean Rosenthal, RN Outcome: Adequate for Discharge 01/31/2021 0836 by Jean Rosenthal, RN Outcome: Progressing   Problem: Activity: Goal: Risk for activity  intolerance will decrease 01/31/2021 1322 by Jean Rosenthal, RN Outcome: Adequate for Discharge 01/31/2021 0836 by Jean Rosenthal, RN Outcome: Progressing   Problem: Coping: Goal: Level of anxiety will decrease 01/31/2021 1322 by Jean Rosenthal, RN Outcome: Adequate for Discharge 01/31/2021 0836 by Jean Rosenthal, RN Outcome: Progressing

## 2021-01-31 NOTE — Progress Notes (Signed)
Physical Therapy Treatment Patient Details Name: Emily Jacobs MRN: [id number] DOB: [birth year] Today's Date: 01/31/2021    History of Present Illness Pt is a 70 y.o. F s/p L TKA on 01/29/21. PMH includes R TKA (2018), DM, Diverticulitis, HLD, HTN, obesity.    PT Comments    Pt seated in recliner w/ daughter in room, notes 0/10 pain at rest. Completed stair training totaling 8 steps with BUE on L rail, min-guard for safety. Pt demonstrated good stability but slightly impulsive requiring cues for redirection. Pt continues to make daily progress but skilled PT intervention is indicated to address deficits in function, mobility, and to return to PLOF as able. Discharge recommendations remain HHPT.   Follow Up Recommendations  Home health PT     Equipment Recommendations  None recommended by PT    Recommendations for Other Services       Precautions / Restrictions Precautions Precautions: Knee;Fall Precaution Booklet Issued: Yes (comment) Restrictions Weight Bearing Restrictions: Yes LLE Weight Bearing: Weight bearing as tolerated    Mobility  Bed Mobility               General bed mobility comments: Pt seated in recliner    Transfers Overall transfer level: Needs assistance Equipment used: Rolling walker (2 wheeled) Transfers: Sit to/from Stand Sit to Stand: Min guard            Ambulation/Gait Ambulation/Gait assistance: Min guard Gait Distance (Feet): 80 Feet Assistive device: Rolling walker (2 wheeled) Gait Pattern/deviations: Decreased stride length;Step-through pattern Gait velocity: Moderate       Stairs Stairs: Yes Stairs assistance: Min guard Stair Management: One rail Left;Step to pattern;Forwards Number of Stairs: 8 General stair comments: Good overall stability, slightly impulsive requires cues to redirect   Wheelchair Mobility    Modified Rankin (Stroke Patients Only)       Balance Overall balance assessment: Needs  assistance Sitting-balance support: Single extremity supported;Feet supported Sitting balance-Leahy Scale: Good Sitting balance - Comments: G static sitting, limited tolerance for dynamic reaching in sitting, mostly d/t LE pain   Standing balance support: During functional activity;Bilateral upper extremity supported Standing balance-Leahy Scale: Fair Standing balance comment: requires BUE                            Cognition Arousal/Alertness: Awake/alert Behavior During Therapy: WFL for tasks assessed/performed Overall Cognitive Status: Within Functional Limits for tasks assessed                                        Exercises      General Comments        Pertinent Vitals/Pain Pain Assessment: No/denies pain    Home Living                      Prior Function            PT Goals (current goals can now be found in the care plan section) Acute Rehab PT Goals Patient Stated Goal: to go home PT Goal Formulation: With patient/family Time For Goal Achievement: 02/12/21 Potential to Achieve Goals: Good Progress towards PT goals: Progressing toward goals    Frequency    BID      PT Plan Current plan remains appropriate    Co-evaluation              AM-PAC  PT "6 Clicks" Mobility   Outcome Measure  Help needed turning from your back to your side while in a flat bed without using bedrails?: A Little Help needed moving from lying on your back to sitting on the side of a flat bed without using bedrails?: A Little Help needed moving to and from a bed to a chair (including a wheelchair)?: A Little Help needed standing up from a chair using your arms (e.g., wheelchair or bedside chair)?: A Little Help needed to walk in hospital room?: A Little Help needed climbing 3-5 steps with a railing? : A Little 6 Click Score: 18    End of Session Equipment Utilized During Treatment: Gait belt Activity Tolerance: Patient tolerated  treatment well Patient left: with call bell/phone within reach;with family/visitor present;with SCD's reapplied;with nursing/sitter in room;in bed;with bed alarm set Nurse Communication: Mobility status PT Visit Diagnosis: Muscle weakness (generalized) (M62.81);Other abnormalities of gait and mobility (R26.89)     Time: 7253-6644 PT Time Calculation (min) (ACUTE ONLY): 39 min  Charges:                        [employer], SPT

## 2021-01-31 NOTE — Plan of Care (Signed)
  Problem: Activity: Goal: Ability to avoid complications of mobility impairment will improve Outcome: Progressing Goal: Range of joint motion will improve Outcome: Progressing   Problem: Clinical Measurements: Goal: Postoperative complications will be avoided or minimized Outcome: Progressing   Problem: Skin Integrity: Goal: Will show signs of wound healing Outcome: Progressing   Problem: Education: Goal: Knowledge of General Education information will improve Description: Including pain rating scale, medication(s)/side effects and non-pharmacologic comfort measures Outcome: Progressing   Problem: Health Behavior/Discharge Planning: Goal: Ability to manage health-related needs will improve Outcome: Progressing   Problem: Clinical Measurements: Goal: Ability to maintain clinical measurements within normal limits will improve Outcome: Progressing Goal: Will remain free from infection Outcome: Progressing Goal: Diagnostic test results will improve Outcome: Progressing Goal: Cardiovascular complication will be avoided Outcome: Progressing   Problem: Activity: Goal: Risk for activity intolerance will decrease Outcome: Progressing   Problem: Nutrition: Goal: Adequate nutrition will be maintained Outcome: Progressing   Problem: Coping: Goal: Level of anxiety will decrease Outcome: Progressing   Problem: Coping: Goal: Level of anxiety will decrease Outcome: Progressing   Problem: Pain Managment: Goal: General experience of comfort will improve Outcome: Progressing   Problem: Safety: Goal: Ability to remain free from injury will improve Outcome: Progressing   Problem: Skin Integrity: Goal: Risk for impaired skin integrity will decrease Outcome: Progressing   Problem: Education: Goal: Ability to describe self-care measures that may prevent or decrease complications (Diabetes Survival Skills Education) will improve Outcome: Progressing   Problem: Coping: Goal:  Ability to adjust to condition or change in health will improve Outcome: Progressing   Problem: Fluid Volume: Goal: Ability to maintain a balanced intake and output will improve Outcome: Progressing   Problem: Health Behavior/Discharge Planning: Goal: Ability to identify and utilize available resources and services will improve Outcome: Progressing Goal: Ability to manage health-related needs will improve Outcome: Progressing   Problem: Nutritional: Goal: Maintenance of adequate nutrition will improve Outcome: Progressing   Problem: Skin Integrity: Goal: Risk for impaired skin integrity will decrease Outcome: Progressing

## 2021-06-19 IMAGING — CT CT KNEE*L* W/O CM
5 of 8 series · 12 of 33 positions shown, 13 images · non-contrast
Comparison: None.

CLINICAL DATA: Chronic left knee pain.

EXAM:
CT OF THE LEFT KNEE WITHOUT CONTRAST
TECHNIQUE: Multidetector CT imaging of the left knee was performed according to
the standard protocol. Multiplanar CT image reconstructions were
also generated.

[Series 2: hip 2.00 · axial · 0.39mm/px · z∈[+1408,+1470]mm · 2 of 95 slices shown]
[im 32/95  bone]
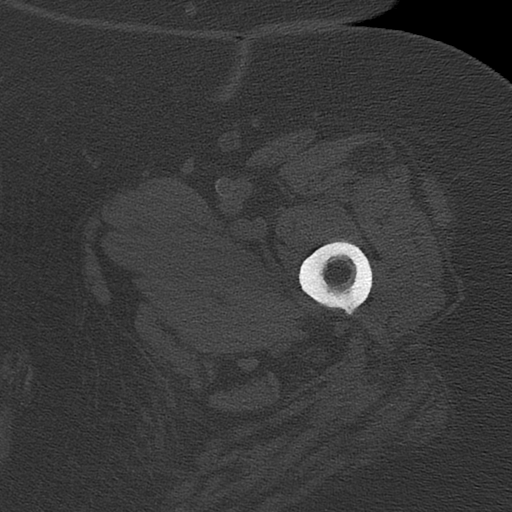
[im 63/95  bone]
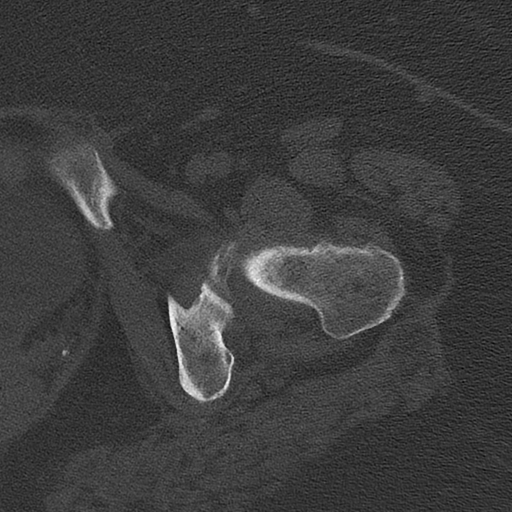

[Series 3: axial bone knee 2.00 ax · axial · 0.38mm/px · z∈[+1088,+1170]mm · 2 of 124 slices shown, 3 images]
[im 42/124  soft-tissue]
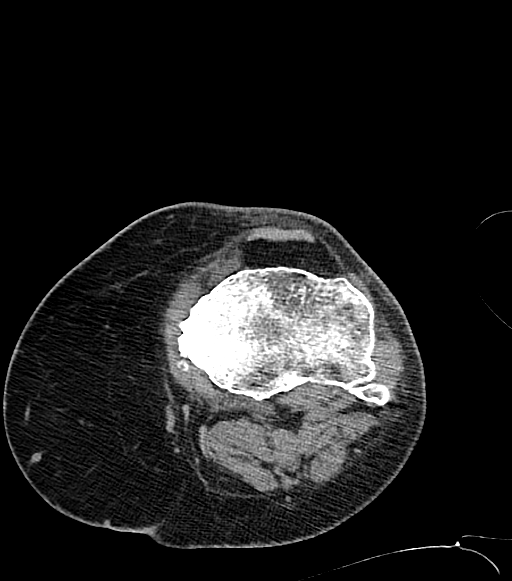
[im 42/124  bone]
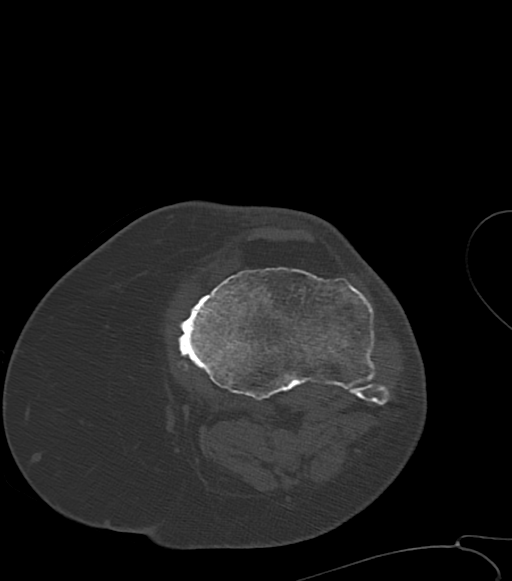
[im 83/124  bone]
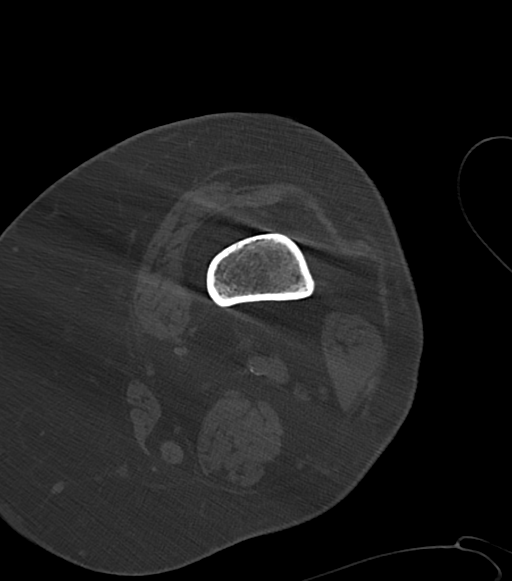

[Series 5: axial st knee 2.00 ax · axial · 0.38mm/px · z∈[+1086,+1168]mm · 2 of 123 slices shown]
[im 41/123  bone]
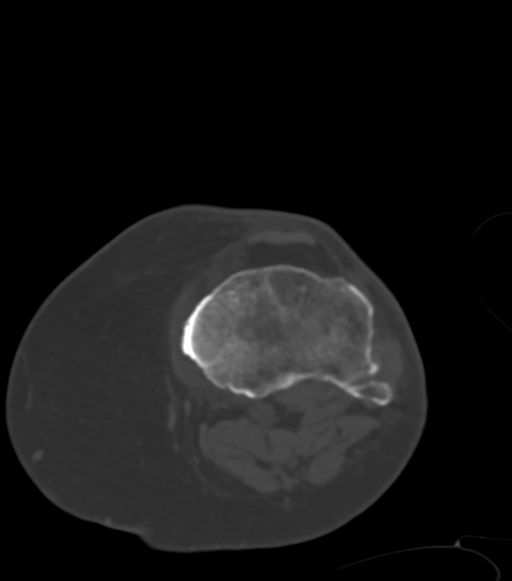
[im 82/123  bone]
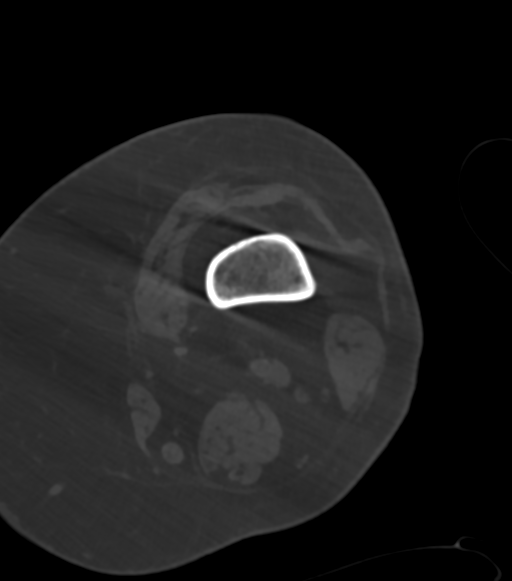

[Series 7: coronal bone knee 2.00 cor · coronal · 0.34mm/px · 1 of 87 slices shown]
[im 44/87  bone]
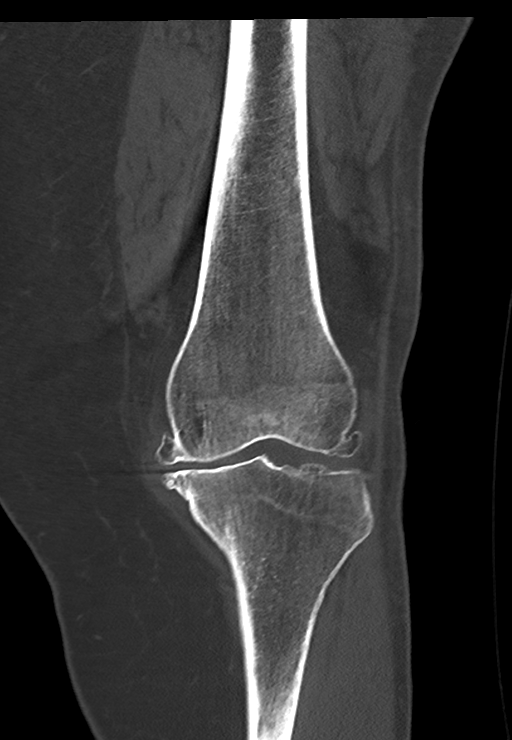

[Series 11: sagittal bone knee 2.00 sag · sagittal · 0.34mm/px · 5 of 87 slices shown]
[im 15/87  bone]
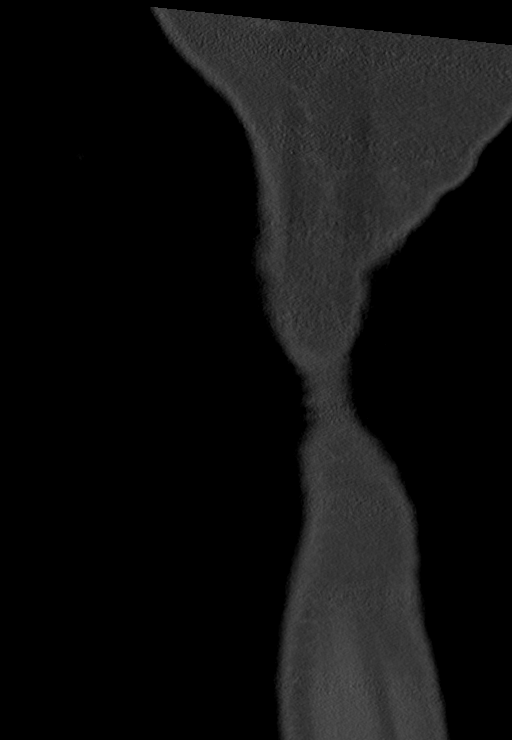
[im 29/87  bone]
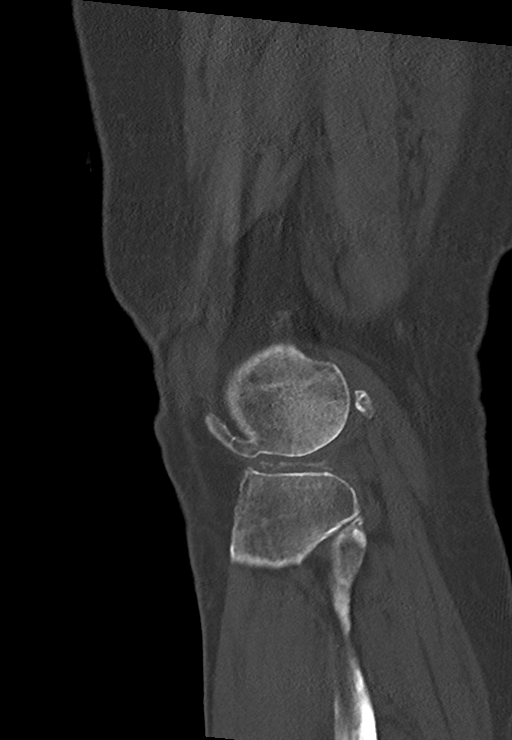
[im 44/87  bone]
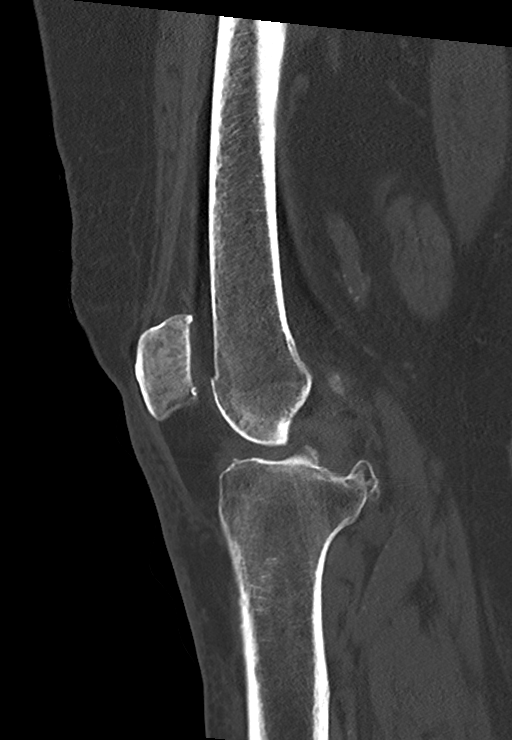
[im 58/87  bone]
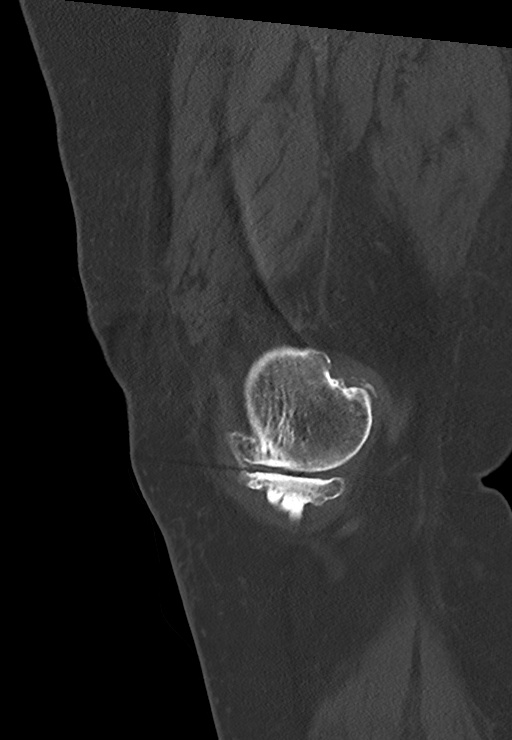
[im 72/87  bone]
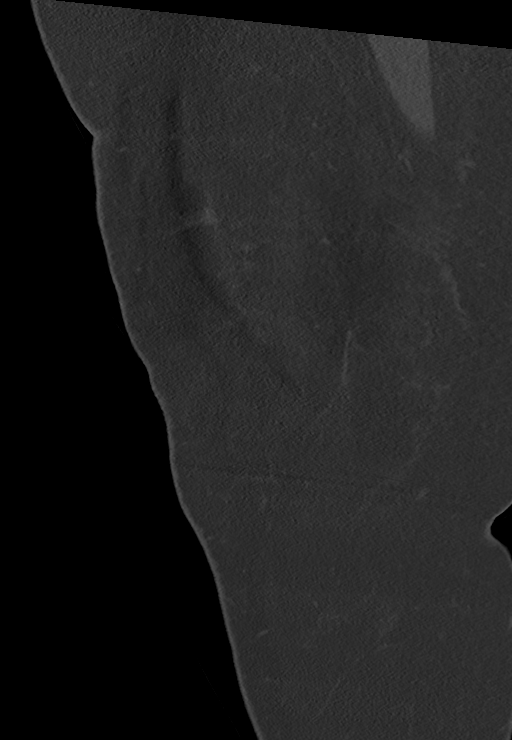

[12 of 33 positions shown; findings below may reference images not displayed]

FINDINGS: Bones/Joint/Cartilage

Tricompartmental degenerative changes, worst in the medial
compartment where there is near bone-on-bone joint space narrowing.
Chondrocalcinosis of the menisci. No joint effusion. No acute
fracture or dislocation.

Mild degenerative changes of the right hip and ankle without acute
abnormality.

Ligaments

Suboptimally assessed by CT.

Muscles and Tendons

Grossly intact.

Soft tissues

Unremarkable.
IMPRESSION: 1. Tricompartmental osteoarthritis, severe in the medial
compartment.

## 2021-08-05 IMAGING — DX DG KNEE 1-2V*L*
2 series · 2 of 2 positions shown · non-contrast
Comparison: Portable exam 4941 hours compared to CT LEFT knee
12/13/2020

CLINICAL DATA: Postop LEFT knee

EXAM:
LEFT KNEE - 1-2 VIEW

[knee ap]
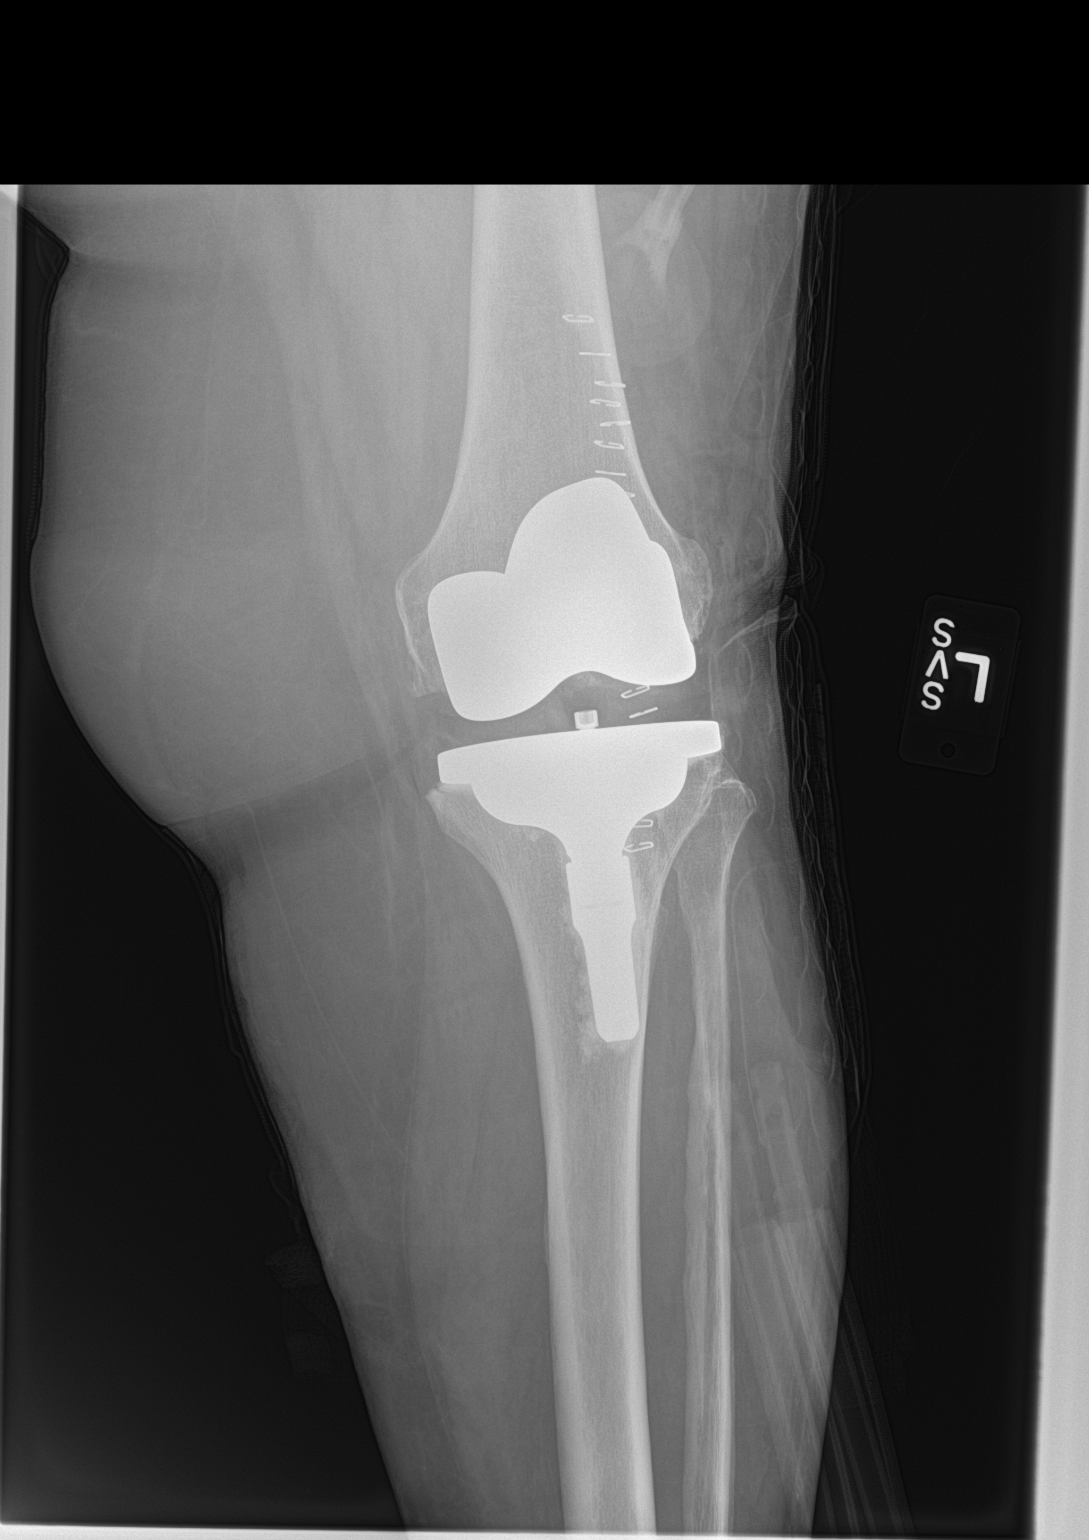

[knee lat]
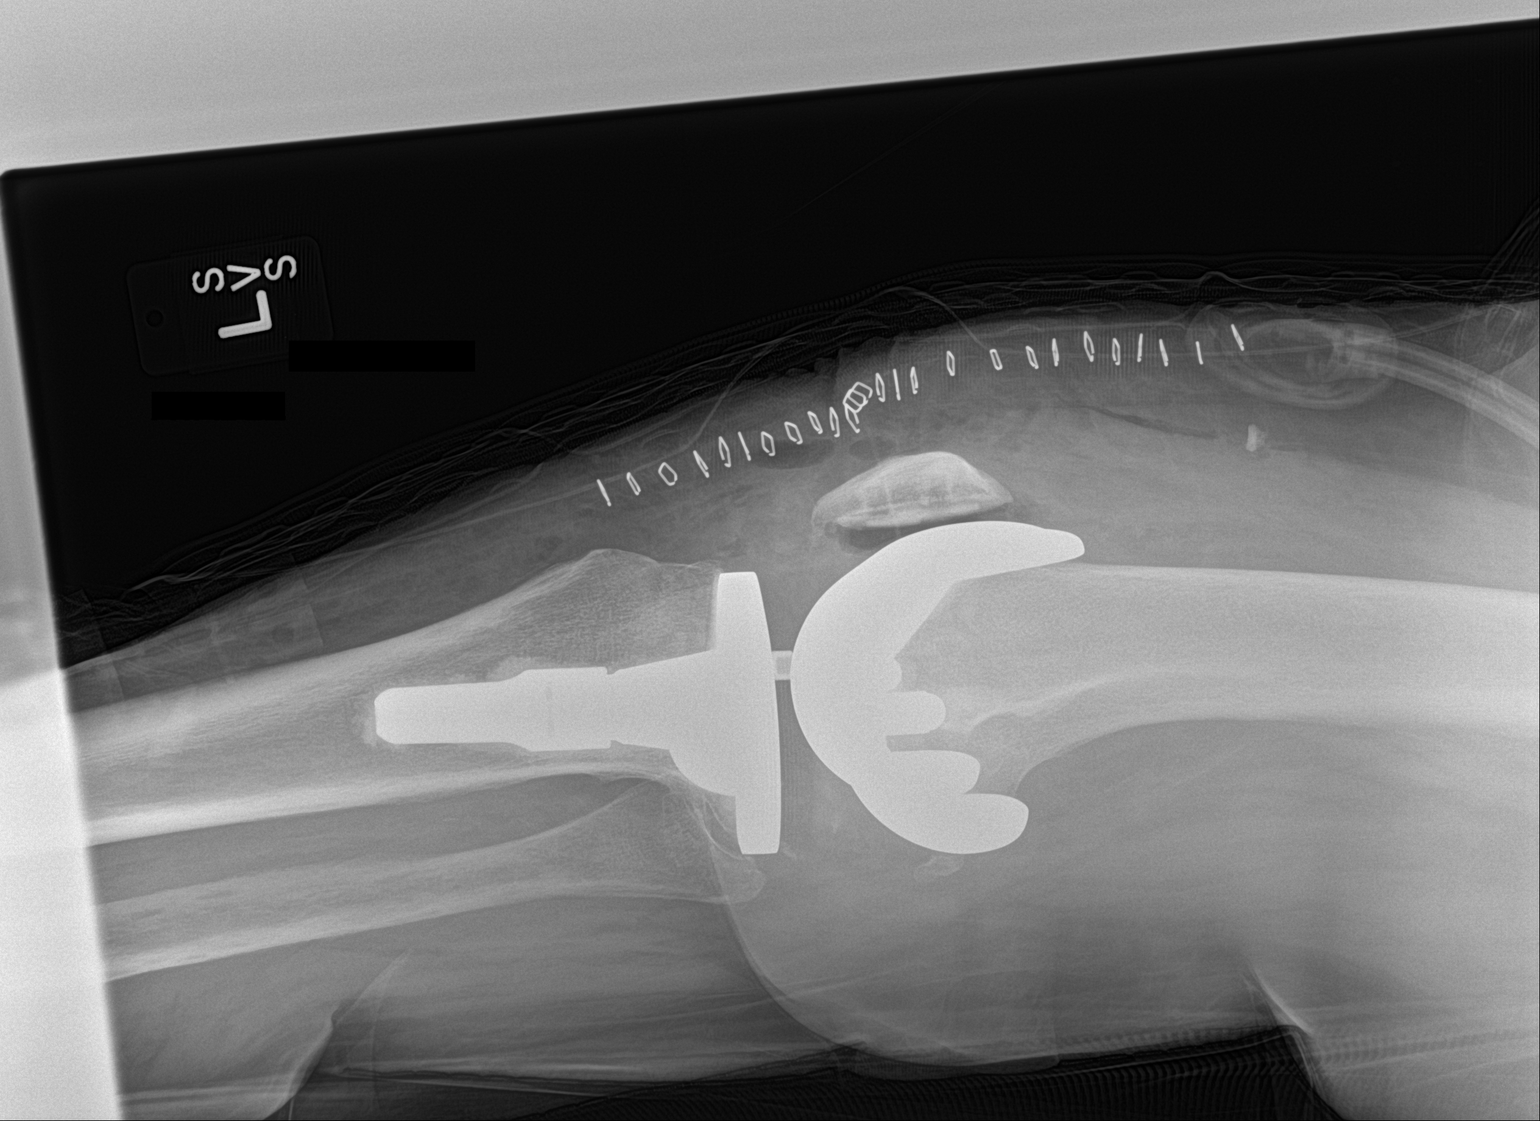

[2 of 2 positions shown; findings below may reference images not displayed]

FINDINGS: Osseous demineralization.

LEFT knee prosthesis identified, stem of tibial component canted
laterally abutting the lateral tibial cortex.

No acute fracture, dislocation, or bone destruction.

Anterior skin clips and wound VAC present.
IMPRESSION: LEFT knee prosthesis as above.

## 2022-09-24 ENCOUNTER — Encounter: Payer: Self-pay | Admitting: Internal Medicine

## 2022-09-24 ENCOUNTER — Ambulatory Visit: Payer: Medicare Other | Admitting: Anesthesiology

## 2022-09-24 ENCOUNTER — Ambulatory Visit
Admission: RE | Admit: 2022-09-24 | Discharge: 2022-09-24 | Disposition: A | Discharge status: Home / Self Care | Payer: Medicare Other | Attending: Internal Medicine | Admitting: Internal Medicine

## 2022-09-24 ENCOUNTER — Other Ambulatory Visit: Payer: Self-pay

## 2022-09-24 ENCOUNTER — Encounter
Admission: RE | Disposition: A | Discharge status: Home / Self Care | Payer: Self-pay | Source: Home / Self Care | Attending: Internal Medicine

## 2022-09-24 DIAGNOSIS — K5730 Diverticulosis of large intestine without perforation or abscess without bleeding: Secondary | ICD-10-CM | POA: Diagnosis not present

## 2022-09-24 DIAGNOSIS — D649 Anemia, unspecified: Secondary | ICD-10-CM | POA: Diagnosis not present

## 2022-09-24 DIAGNOSIS — E785 Hyperlipidemia, unspecified: Secondary | ICD-10-CM | POA: Diagnosis not present

## 2022-09-24 DIAGNOSIS — Z1211 Encounter for screening for malignant neoplasm of colon: Secondary | ICD-10-CM | POA: Diagnosis not present

## 2022-09-24 DIAGNOSIS — D124 Benign neoplasm of descending colon: Secondary | ICD-10-CM | POA: Diagnosis not present

## 2022-09-24 DIAGNOSIS — D759 Disease of blood and blood-forming organs, unspecified: Secondary | ICD-10-CM | POA: Insufficient documentation

## 2022-09-24 DIAGNOSIS — E119 Type 2 diabetes mellitus without complications: Secondary | ICD-10-CM | POA: Diagnosis not present

## 2022-09-24 DIAGNOSIS — Z6841 Body Mass Index (BMI) 40.0 and over, adult: Secondary | ICD-10-CM | POA: Insufficient documentation

## 2022-09-24 DIAGNOSIS — K640 First degree hemorrhoids: Secondary | ICD-10-CM | POA: Diagnosis not present

## 2022-09-24 DIAGNOSIS — I10 Essential (primary) hypertension: Secondary | ICD-10-CM | POA: Diagnosis not present

## 2022-09-24 DIAGNOSIS — Z7984 Long term (current) use of oral hypoglycemic drugs: Secondary | ICD-10-CM | POA: Insufficient documentation

## 2022-09-24 HISTORY — PX: COLONOSCOPY: SHX5424

## 2022-09-24 LAB — GLUCOSE, CAPILLARY: Glucose-Capillary: 125 mg/dL — ABNORMAL HIGH (ref 70–99)

## 2022-09-24 SURGERY — COLONOSCOPY
Anesthesia: General

## 2022-09-24 MED ORDER — PROPOFOL 500 MG/50ML IV EMUL
INTRAVENOUS | Status: DC | PRN
  Administered 2022-09-24: 150 ug/kg/min via INTRAVENOUS

## 2022-09-24 MED ORDER — PHENYLEPHRINE HCL (PRESSORS) 10 MG/ML IV SOLN
INTRAVENOUS | Status: DC | PRN
  Administered 2022-09-24: 100 ug via INTRAVENOUS

## 2022-09-24 MED ORDER — SODIUM CHLORIDE 0.9 % IV SOLN: INTRAVENOUS | Status: DC

## 2022-09-24 NOTE — H&P (Signed)
Outpatient short stay form Pre-procedure 09/24/2022 12:43 PM Emily Jacobs K. Emily Jacobs, M.D.  Primary Physician: Johny Drilling, M.D.  Reason for visit:  Personal history of colon polyps  History of present illness:  Emily Jacobs is a 72 year old female with history of T2DM, HTN, HLD, morbid obesity, returns for follow-up colonoscopy for personal history of colon polyps.  Patient denies any upper or lower GI complaints. Reports normal bowel habits. No chest pain, dysphagia, reflux, regurgitation, epigastric pain, nausea/vomiting, or melena. No weight loss, anorexia, abdominal pain, change in bowel habits, or hematochezia.  colonoscopy: 09/21/2013 for follow-up colon polyp: revealed 2 polyps, diverticulosis and internal hemorrhoids. Pathology returned one tubular adenoma and one hyperplastic polyp. -02/03/2019: Colonoscopy: Indication personal history of colon polyps impression: Diverticulosis, 2 TA polyps, non bleeding internal hemorrhoids. Repeat 2023.    Current Facility-Administered Medications:    0.9 %  sodium chloride infusion, , Intravenous, Continuous, [place], Benay Pike, MD, Last Rate: 20 mL/hr at 09/24/22 1146, New Bag at 09/24/22 1146  Medications Prior to Admission  Medication Sig Dispense Refill Last Dose   Cholecalciferol (VITAMIN D) 2000 units CAPS Take 2,000 Units by mouth daily.   Past Week   docusate sodium (COLACE) 100 MG capsule Take 1 capsule (100 mg total) by mouth 2 (two) times daily. 10 capsule 0 Past Week   glipiZIDE (GLUCOTROL XL) 10 MG 24 hr tablet Take 10 mg by mouth daily with breakfast.   Past Week   lisinopril-hydrochlorothiazide (PRINZIDE,ZESTORETIC) 20-25 MG tablet Take 1.5 tablets by mouth daily.  11 09/24/2022 at 0630   metFORMIN (GLUCOPHAGE) 1000 MG tablet Take 1,000 mg by mouth 2 (two) times daily.  11 Past Week   metoprolol succinate (TOPROL-XL) 25 MG 24 hr tablet Take 25 mg by mouth daily.  11 09/24/2022 at 0630   neomycin-bacitracin-polymyxin (NEOSPORIN) ointment  Apply 1 application topically as needed for wound care.   Past Week   OVER THE COUNTER MEDICATION Take 1 tablet by mouth daily. diabetes doctor blood sugar 24 hour otc supplement   Past Week   pioglitazone (ACTOS) 45 MG tablet Take 45 mg by mouth daily.  11 09/23/2022   rosuvastatin (CRESTOR) 20 MG tablet Take 20 mg by mouth at bedtime.   09/23/2022   traMADol (ULTRAM) 50 MG tablet Take 1 tablet (50 mg total) by mouth every 6 (six) hours as needed. 30 tablet 0 09/23/2022   enoxaparin (LOVENOX) 40 MG/0.4ML injection Inject 0.4 mLs (40 mg total) into the skin daily for 14 days. 5.6 mL 0    HYDROcodone-acetaminophen (NORCO/VICODIN) 5-325 MG tablet Take 1-2 tablets by mouth every 4 (four) hours as needed for moderate pain (pain score 4-6). 30 tablet 0    methocarbamol (ROBAXIN) 500 MG tablet Take 1 tablet (500 mg total) by mouth every 6 (six) hours as needed for muscle spasms. 30 tablet 0      Allergies  Allergen Reactions   Other Swelling and Other (See Comments)    Corn, tea, and eggs: nasal congestion   Amlodipine Other (See Comments)    Headache   Codeine Nausea And Vomiting     Past Medical History:  Diagnosis Date   Anemia    Arthritis    Osteoarthritis   Diabetes mellitus without complication (HCC)    Diverticulosis    History of kidney stones    Hypercholesteremia    Hypertension    PONV (postoperative nausea and vomiting)     Review of systems:  Otherwise negative.    Physical Exam  Gen:  Alert, oriented. Appears stated age.  HEENT: [place]/AT. PERRLA. Lungs: CTA, no wheezes. CV: RR nl S1, S2. Abd: soft, benign, no masses. BS+ Ext: No edema. Pulses 2+    Planned procedures: Proceed with colonoscopy. The patient understands the nature of the planned procedure, indications, risks, alternatives and potential complications including but not limited to bleeding, infection, perforation, damage to internal organs and possible oversedation/side effects from anesthesia. The patient  agrees and gives consent to proceed.  Please refer to procedure notes for findings, recommendations and patient disposition/instructions.     Jackelin Correia K. Emily Jacobs, M.D. Gastroenterology 09/24/2022  12:43 PM

## 2022-09-24 NOTE — Anesthesia Postprocedure Evaluation (Signed)
Anesthesia Post Note  Patient: Emily Jacobs  Procedure(s) Performed: COLONOSCOPY  Patient location during evaluation: Endoscopy Anesthesia Type: General Level of consciousness: awake and alert Pain management: pain level controlled Vital Signs Assessment: post-procedure vital signs reviewed and stable Respiratory status: spontaneous breathing, nonlabored ventilation, respiratory function stable and patient connected to nasal cannula oxygen Cardiovascular status: blood pressure returned to baseline and stable Postop Assessment: no apparent nausea or vomiting Anesthetic complications: no   There were no known notable events for this encounter.   Last Vitals:  Vitals:   09/24/22 1313 09/24/22 1320  BP:  (!) 97/48  Pulse:    Resp:    Temp: 36.7 C   SpO2:      Last Pain:  Vitals:   09/24/22 1323  TempSrc:   PainSc: 0-No pain                 Ilene Qua

## 2022-09-24 NOTE — Interval H&P Note (Signed)
History and Physical Interval Note:  09/24/2022 12:44 PM  Emily Jacobs  has presented today for surgery, with the diagnosis of Hx of adenomatous colonic polyps (Z86.010).  The various methods of treatment have been discussed with the patient and family. After consideration of risks, benefits and other options for treatment, the patient has consented to  Procedure(s): COLONOSCOPY (N/A) as a surgical intervention.  The patient's history has been reviewed, patient examined, no change in status, stable for surgery.  I have reviewed the patient's chart and labs.  Questions were answered to the patient's satisfaction.     [place], [place]

## 2022-09-24 NOTE — Op Note (Signed)
[facility] Gastroenterology Patient Name: Emily Jacobs Procedure Date: 09/24/2022 11:56 AM MRN: [id number] Account #: [id number] Date of Birth: [birth year] Admit Type: Outpatient Age: 72 Room: [facility] ENDO ROOM 2 Gender: Female Note Status: Finalized Instrument Name: Jasper Riling [id number] Procedure:             Colonoscopy Indications:           High risk colon cancer surveillance: Personal history                         of non-advanced adenoma Providers:             Benay Pike. Alice Reichert MD, MD Referring MD:          Wynona Canes. Kym Groom, MD (Referring MD) Medicines:             Propofol per Anesthesia Complications:         No immediate complications. Procedure:             Pre-Anesthesia Assessment:                        - The risks and benefits of the procedure and the                         sedation options and risks were discussed with the                         patient. All questions were answered and informed                         consent was obtained.                        - Patient identification and proposed procedure were                         verified prior to the procedure by the nurse. The                         procedure was verified in the procedure room.                        - ASA Grade Assessment: III - A patient with severe                         systemic disease.                        - After reviewing the risks and benefits, the patient                         was deemed in satisfactory condition to undergo the                         procedure.                        After obtaining informed consent, the colonoscope was                         passed under  direct vision. Throughout the procedure,                         the patient's blood pressure, pulse, and oxygen                         saturations were monitored continuously. The                         Colonoscope was introduced through the anus and                         advanced to  the the cecum, identified by appendiceal                         orifice and ileocecal valve. The colonoscopy was                         performed without difficulty. The patient tolerated                         the procedure well. The quality of the bowel                         preparation was good. The ileocecal valve, appendiceal                         orifice, and rectum were photographed. Findings:      The perianal and digital rectal examinations were normal. Pertinent       negatives include normal sphincter tone and no palpable rectal lesions.      Non-bleeding internal hemorrhoids were found during retroflexion. The       hemorrhoids were Grade I (internal hemorrhoids that do not prolapse).      Multiple medium-mouthed and small-mouthed diverticula were found in the       sigmoid colon. There was no evidence of diverticular bleeding.      A 5 mm polyp was found in the descending colon. The polyp was sessile.       The polyp was removed with a jumbo cold forceps. Resection and retrieval       were complete.      The exam was otherwise without abnormality. Impression:            - Non-bleeding internal hemorrhoids.                        - Moderate diverticulosis in the sigmoid colon. There                         was no evidence of diverticular bleeding.                        - One 5 mm polyp in the descending colon, removed with                         a jumbo cold forceps. Resected and retrieved.                        - The examination was otherwise normal. Recommendation:        -  Patient has a contact number available for                         emergencies. The signs and symptoms of potential                         delayed complications were discussed with the patient.                         Return to normal activities tomorrow. Written                         discharge instructions were provided to the patient.                        - Resume previous diet.                         - Continue present medications.                        - Repeat colonoscopy is recommended for adenoma                         surveillance. The colonoscopy date will be determined                         after pathology results from today's exam become                         available for review.                        - Return to GI office PRN.                        - The findings and recommendations were discussed with                         the patient. Procedure Code(s):     --- Professional ---                        [phone], Colonoscopy, flexible; with biopsy, single or                         multiple Diagnosis Code(s):     --- Professional ---                        K57.30, Diverticulosis of large intestine without                         perforation or abscess without bleeding                        D12.4, Benign neoplasm of descending colon                        K64.0, First degree hemorrhoids  Z86.010, Personal history of colonic polyps CPT copyright 2022 [employer]. All rights reserved. The codes documented in this report are preliminary and upon coder review may  be revised to meet current compliance requirements. Efrain Sella MD, MD 09/24/2022 1:10:16 PM This report has been signed electronically. Number of Addenda: 0 Note Initiated On: 09/24/2022 11:56 AM Scope Withdrawal Time: 0 hours 5 minutes 25 seconds  Total Procedure Duration: 0 hours 10 minutes 28 seconds  Estimated Blood Loss:  Estimated blood loss: none.      [facility]

## 2022-09-24 NOTE — Anesthesia Preprocedure Evaluation (Signed)
Anesthesia Evaluation  Patient identified by MRN, date of birth, ID band Patient awake    Reviewed: Allergy & Precautions, NPO status , Patient's Chart, lab work & pertinent test results  History of Anesthesia Complications (+) PONV and history of anesthetic complications  Airway Mallampati: III  TM Distance: >3 FB Neck ROM: Full    Dental  (+) Implants   Pulmonary neg pulmonary ROS, neg sleep apnea, neg COPD   breath sounds clear to auscultation- rhonchi (-) wheezing      Cardiovascular Exercise Tolerance: Good hypertension, Pt. on medications (-) CAD, (-) Past MI, (-) Cardiac Stents and (-) CABG  Rhythm:Regular Rate:Normal - Systolic murmurs and - Diastolic murmurs    Neuro/Psych neg Seizures negative neurological ROS  negative psych ROS   GI/Hepatic negative GI ROS, Neg liver ROS,,,  Endo/Other  diabetes, Oral Hypoglycemic Agents    Renal/GU negative Renal ROS     Musculoskeletal  (+) Arthritis ,    Abdominal  (+) + obese  Peds  Hematology  (+) Blood dyscrasia, anemia   Anesthesia Other Findings Past Medical History: No date: Anemia No date: Arthritis     Comment:  Osteoarthritis No date: Diabetes mellitus without complication (HCC) No date: Diverticulosis No date: History of kidney stones No date: Hypercholesteremia No date: Hypertension No date: PONV (postoperative nausea and vomiting)   Reproductive/Obstetrics                             Lab Results  Component Value Date   WBC 6.9 01/30/2021   HGB 10.8 (L) 01/30/2021   HCT 33.0 (L) 01/30/2021   MCV 98.2 01/30/2021   PLT 197 01/30/2021    Anesthesia Physical Anesthesia Plan  ASA: 2  Anesthesia Plan: General   Post-op Pain Management: Minimal or no pain anticipated   Induction: Intravenous  PONV Risk Score and Plan: 3 and Propofol infusion, TIVA and Ondansetron  Airway Management Planned: Natural Airway and  Nasal Cannula  Additional Equipment: None  Intra-op Plan:   Post-operative Plan:   Informed Consent: I have reviewed the patients History and Physical, chart, labs and discussed the procedure including the risks, benefits and alternatives for the proposed anesthesia with the patient or authorized representative who has indicated his/her understanding and acceptance.     Dental advisory given  Plan Discussed with: CRNA and Anesthesiologist  Anesthesia Plan Comments: (Discussed risks of anesthesia with patient, including possibility of difficulty with spontaneous ventilation under anesthesia necessitating airway intervention, PONV, and rare risks such as cardiac or respiratory or neurological events, and allergic reactions. Discussed the role of CRNA in patient's perioperative care. Patient understands.)        Anesthesia Quick Evaluation

## 2022-09-24 NOTE — Transfer of Care (Signed)
Immediate Anesthesia Transfer of Care Note  Patient: Emily Jacobs  Procedure(s) Performed: COLONOSCOPY  Patient Location: PACU  Anesthesia Type:General  Level of Consciousness: awake and sedated  Airway & Oxygen Therapy: Patient Spontanous Breathing and Patient connected to nasal cannula oxygen  Post-op Assessment: Report given to RN and Post -op Vital signs reviewed and stable  Post vital signs: Reviewed and stable  Last Vitals:  Vitals Value Taken Time  BP 93/77 09/24/22 1312  Temp 36.7 C 09/24/22 1313  Pulse 69 09/24/22 1314  Resp 17 09/24/22 1314  SpO2 98 % 09/24/22 1314  Vitals shown include unvalidated device data.  Last Pain:  Vitals:   09/24/22 1313  TempSrc: Temporal  PainSc: 0-No pain         Complications: There were no known notable events for this encounter.

## 2022-09-25 ENCOUNTER — Encounter: Payer: Self-pay | Admitting: Internal Medicine

## 2022-09-25 LAB — SURGICAL PATHOLOGY

## 2024-04-18 ENCOUNTER — Other Ambulatory Visit: Payer: Self-pay | Admitting: Family Medicine

## 2024-04-18 ENCOUNTER — Encounter: Payer: Self-pay | Admitting: Family Medicine

## 2024-04-18 DIAGNOSIS — Z780 Asymptomatic menopausal state: Secondary | ICD-10-CM

## 2024-05-25 ENCOUNTER — Ambulatory Visit
Admission: RE | Admit: 2024-05-25 | Discharge: 2024-05-25 | Disposition: A | Discharge status: Home / Self Care | Source: Ambulatory Visit | Attending: Family Medicine | Admitting: Family Medicine

## 2024-05-25 DIAGNOSIS — Z780 Asymptomatic menopausal state: Secondary | ICD-10-CM | POA: Insufficient documentation

## 2024-05-25 DIAGNOSIS — Z1231 Encounter for screening mammogram for malignant neoplasm of breast: Secondary | ICD-10-CM | POA: Diagnosis present
